# Patient Record
Sex: Female | Born: 1980 | Race: White | Hispanic: No | Marital: Married | Smoking: Never smoker
Health system: Southern US, Community
[De-identification: ages and names within clinical notes are randomized; demographics above are authoritative.]

## PROBLEM LIST (undated history)

## (undated) DIAGNOSIS — E079 Disorder of thyroid, unspecified: Secondary | ICD-10-CM

## (undated) DIAGNOSIS — N200 Calculus of kidney: Secondary | ICD-10-CM

## (undated) HISTORY — DX: Calculus of kidney: N20.0

## (undated) HISTORY — PX: CYST REMOVAL HAND: SHX6279

---

## 2020-04-16 ENCOUNTER — Emergency Department (HOSPITAL_COMMUNITY)
Admission: EM | Admit: 2020-04-16 | Discharge: 2020-04-16 | Disposition: A | Discharge status: Home / Self Care | Payer: 59 | Attending: Emergency Medicine | Admitting: Emergency Medicine

## 2020-04-16 ENCOUNTER — Other Ambulatory Visit: Payer: Self-pay

## 2020-04-16 DIAGNOSIS — T782XXA Anaphylactic shock, unspecified, initial encounter: Secondary | ICD-10-CM | POA: Insufficient documentation

## 2020-04-16 DIAGNOSIS — R062 Wheezing: Secondary | ICD-10-CM | POA: Insufficient documentation

## 2020-04-16 DIAGNOSIS — R202 Paresthesia of skin: Secondary | ICD-10-CM | POA: Diagnosis not present

## 2020-04-16 MED ORDER — EPINEPHRINE 0.3 MG/0.3ML IJ SOAJ: 0.3000 mg | Freq: Once | INTRAMUSCULAR | 1 refills | Status: AC

## 2020-04-16 MED ORDER — EPINEPHRINE 0.3 MG/0.3ML IJ SOAJ
INTRAMUSCULAR | Status: AC
  Filled 2020-04-16: qty 0.3

## 2020-04-16 MED ORDER — PREDNISONE 20 MG PO TABS: 20.0000 mg | ORAL_TABLET | Freq: Two times a day (BID) | ORAL | 0 refills | Status: DC

## 2020-04-16 MED ORDER — FAMOTIDINE IN NACL 20-0.9 MG/50ML-% IV SOLN
20.0000 mg | Freq: Once | INTRAVENOUS | Status: AC
  Administered 2020-04-16: 20 mg via INTRAVENOUS
  Filled 2020-04-16: qty 50

## 2020-04-16 MED ORDER — PREDNISONE 20 MG PO TABS
60.0000 mg | ORAL_TABLET | Freq: Once | ORAL | Status: AC
  Administered 2020-04-16: 60 mg via ORAL
  Filled 2020-04-16: qty 3

## 2020-04-16 NOTE — ED Triage Notes (Signed)
Pt arrives by [facility] w/ complaints of having an allergic rxn after feeding a student food that had tree nuts in it. Pt is allergic to tree nuts. At the school, the pt had swelling of the throat,tongue and itchiness, the staff also heard wheezing. Staff at school administered 2 epi auto injector pens and when EMS arrived on scene they gave 50 mg of Benadryl IV.  Pt ambulatory, AOx4. NAD at this time.    BP-125/60 HR-86 SpO2-100%  Temp-99.2

## 2020-04-16 NOTE — Discharge Instructions (Addendum)
Continue treating the allergic reaction and anaphylaxis with the prednisone prescription as prescribed, beginning this evening.  Also take famotidine 20 mg twice a day for 5 days, and diphenhydramine 25 mg 4 times a day for 5 days.  Get plenty of rest and drink a lot of fluids.  Return here if needed for problems.

## 2020-04-16 NOTE — ED Notes (Signed)
Pt stated mouth numb and throat feels like it is closing up. Informed Dr. Effie Shy.

## 2020-04-16 NOTE — ED Provider Notes (Signed)
[facility] EMERGENCY DEPARTMENT Provider Note   CSN: [id number] Arrival date & time: 04/16/20  1018     History Chief Complaint  Patient presents with  . Allergic Reaction    Diana Pierce is a 39 y.o. female.  HPI She is here for evaluation and treatment of her allergic reaction which began this morning.  She first noticed tingling sensation in her tongue, followed by swelling of her lips and heaviness of her tongue.  She went to a nurse at her employment site, and was treated with epinephrine injection, had increased wheezing, and was treated with a second epinephrine injection.  She had some wheezing before the second injection and felt like her throat was closing up.  EMS arrived, she was transferred here.  During transport she received IV Benadryl, 50 mg.  She had normal blood pressure during transport.  Patient feels like her tongue and throat problem have resolved.  She currently feels tired, "like I just ran a marathon."  She has a history of peanut allergy, 5 years ago and feels like she may have been exposed to peanuts today, when she kissed her child this morning before going to work.  No other recent illnesses.  There are no other known modifying factors.    No past medical history on file.  There are no problems to display for this patient.     OB History   No obstetric history on file.     No family history on file.  Social History   Tobacco Use  . Smoking status: Not on file  Substance Use Topics  . Alcohol use: Not on file  . Drug use: Not on file    Home Medications Prior to Admission medications   Medication Sig Start Date End Date Taking? Authorizing Provider  levothyroxine (SYNTHROID) 50 MCG tablet Take 50 mcg by mouth daily. 04/04/20  Yes [provider]  EPINEPHrine 0.3 mg/0.3 mL IJ SOAJ injection Inject 0.3 mg into the muscle once for 1 dose. For severe allergic reaction 04/16/20 04/16/20  Mancel Bale, MD    predniSONE (DELTASONE) 20 MG tablet Take 1 tablet (20 mg total) by mouth 2 (two) times daily. 04/16/20   Mancel Bale, MD    Allergies    Other  Review of Systems   Review of Systems  All other systems reviewed and are negative.   Physical Exam Updated Vital Signs BP 102/64   Pulse 76   Temp 99 F (37.2 C) (Oral)   Resp 15   Ht 5\' 3"  (1.6 m)   Wt 57.6 kg   SpO2 98%   BMI 22.50 kg/m   Physical Exam Vitals and nursing note reviewed.  Constitutional:      Appearance: She is well-developed.  HENT:     Head: Normocephalic and atraumatic.     Right Ear: External ear normal.     Left Ear: External ear normal.     Nose: Nose normal.     Mouth/Throat:     Mouth: Mucous membranes are moist.     Pharynx: No oropharyngeal exudate or posterior oropharyngeal erythema.     Comments: No swelling of lips, tongue, sublingual space or oropharynx. Eyes:     Conjunctiva/sclera: Conjunctivae normal.     Pupils: Pupils are equal, round, and reactive to light.  Neck:     Trachea: Phonation normal.  Cardiovascular:     Rate and Rhythm: Normal rate and regular rhythm.     Heart sounds: Normal heart  sounds.  Pulmonary:     Effort: Pulmonary effort is normal. No respiratory distress.     Breath sounds: Normal breath sounds. No stridor. No wheezing or rhonchi.  Abdominal:     Palpations: Abdomen is soft.     Tenderness: There is no abdominal tenderness.  Musculoskeletal:        General: Normal range of motion.     Cervical back: Normal range of motion and neck supple.  Skin:    General: Skin is warm and dry.  Neurological:     Mental Status: She is alert and oriented to person, place, and time.     Cranial Nerves: No cranial nerve deficit.     Sensory: No sensory deficit.     Motor: No abnormal muscle tone.     Coordination: Coordination normal.  Psychiatric:        Mood and Affect: Mood normal.        Behavior: Behavior normal.        Thought Content: Thought content normal.         Judgment: Judgment normal.     ED Results / Procedures / Treatments   Labs (all labs ordered are listed, but only abnormal results are displayed) Labs Reviewed - No data to display  EKG None  Radiology No results found.  Procedures Procedures (including critical care time)  Medications Ordered in ED Medications  EPINEPHrine (EPI-PEN) 0.3 mg/0.3 mL injection (has no administration in time range)  predniSONE (DELTASONE) tablet 60 mg (60 mg Oral Given 04/16/20 1046)  famotidine (PEPCID) IVPB 20 mg premix (0 mg Intravenous Stopped 04/16/20 1344)    ED Course  I have reviewed the triage vital signs and the nursing notes.  Pertinent labs & imaging results that were available during my care of the patient were reviewed by me and considered in my medical decision making (see chart for details).  Clinical Course as of Apr 16 1632  Wed Apr 16, 2020  1135 At this time, the patient feels like her throat is closing up.  Vital signs including oxygen saturation is normal.  No evidence of swelling of tongue, or oropharynx.  No stridor.  On auscultation, she has shallow inspiration, but there is no audible wheezes, rales or rhonchi.  There is no stridor or respiratory distress.  She is lying supine.  She states that she is breathing shallowly, because of the discomfort in her throat.  No indication for epinephrine at this time.  Will add Pepcid.   [EW]  1244 Pepcid was started about an hour ago and the patient now states that she is feeling better but is sleepy.  No respiratory distress at this time.  We will continue to observe for 1 more hour.   [EW]    Clinical Course User Index [EW] Mancel Bale, MD   MDM Rules/Calculators/A&P                           Patient Vitals for the past 24 hrs:  BP Temp Temp src Pulse Resp SpO2 Height Weight  04/16/20 1545 102/64 -- -- 76 15 98 % -- --  04/16/20 1540 -- -- -- 72 19 96 % -- --  04/16/20 1505 (!) 106/55 99 F (37.2 C) Oral 71 18 98 %  -- --  04/16/20 1415 103/71 -- -- 76 20 97 % -- --  04/16/20 1330 101/62 -- -- 64 20 98 % -- --  04/16/20 1315 (!) 102/58 -- --  65 20 98 % -- --  04/16/20 1300 (!) 105/56 -- -- 69 20 98 % -- --  04/16/20 1245 (!) 110/59 -- -- 79 16 98 % -- --  04/16/20 1230 (!) 107/54 -- -- 65 19 98 % -- --  04/16/20 1215 105/61 -- -- 66 19 99 % -- --  04/16/20 1200 109/60 -- -- 66 18 99 % -- --  04/16/20 1145 (!) 110/57 -- -- 77 18 98 % -- --  04/16/20 1130 116/64 -- -- 81 19 100 % -- --  04/16/20 1100 (!) 110/58 -- -- 71 (!) 25 99 % -- --  04/16/20 1035 119/76 98.4 F (36.9 C) -- 87 16 99 % -- --  04/16/20 1024 -- -- -- -- -- -- 5\' 3"  (1.6 m) 57.6 kg    At discharge- reevaluation with update and discussion. After initial assessment and treatment, an updated evaluation reveals she is comfortable and has no respiratory distress.  Findings discussed with the patient and all questions were answered.   Medical Decision Making:  This patient is presenting for evaluation of allergic reaction, which does require a range of treatment options, and is a complaint that involves a high risk of morbidity and mortality. The differential diagnoses include anaphylaxis, peanut allergy, nonspecific complaints. I decided to review old records, and in summary patient with history of peanut allergy with signs and symptoms of anaphylaxis, without documented low blood pressure, presenting in improved state after receiving epinephrine injection x2..  I not require additional historical information from anyone.   Critical Interventions-clinical evaluation, medication treatment, observation and reassessment  After These Interventions, the Patient was reevaluated and was found stable for discharge.  Anaphylaxis, without low blood pressure.  Patient improved after treatment both outpatient and here in the ED.  No ongoing allergic reaction requiring hospitalization or further ED intervention  CRITICAL  CARE-yes Performed by: Mancel Bale  Nursing Notes Reviewed/ Care Coordinated Applicable Imaging Reviewed Interpretation of Laboratory Data incorporated into ED treatment  The patient appears reasonably screened and/or stabilized for discharge and I doubt any other medical condition or other [facility] requiring further screening, evaluation, or treatment in the ED at this time prior to discharge.  Plan: Home Medications-continue current medications; Home Treatments-gradual advance diet and activity; return here if the recommended treatment, does not improve the symptoms; Recommended follow up-PCP follow-up 1 week, consider allergy consultation.     Final Clinical Impression(s) / ED Diagnoses Final diagnoses:  Anaphylaxis, initial encounter    Rx / DC Orders ED Discharge Orders         Ordered    EPINEPHrine 0.3 mg/0.3 mL IJ SOAJ injection   Once        04/16/20 1548    predniSONE (DELTASONE) 20 MG tablet  2 times daily        04/16/20 1548           13/03/21, MD 04/16/20 [phone]

## 2020-04-16 NOTE — ED Notes (Signed)
Pt discharged ambulatory. All questions and concerns addressed. No complaints at this time.   

## 2020-08-25 ENCOUNTER — Other Ambulatory Visit (HOSPITAL_COMMUNITY): Payer: Self-pay | Admitting: Internal Medicine

## 2020-08-25 DIAGNOSIS — Z8249 Family history of ischemic heart disease and other diseases of the circulatory system: Secondary | ICD-10-CM

## 2020-09-22 ENCOUNTER — Other Ambulatory Visit: Payer: Self-pay

## 2020-09-22 ENCOUNTER — Other Ambulatory Visit (HOSPITAL_COMMUNITY): Payer: Self-pay | Admitting: Internal Medicine

## 2020-09-22 ENCOUNTER — Ambulatory Visit (HOSPITAL_COMMUNITY): Payer: 59 | Attending: Cardiology

## 2020-09-22 DIAGNOSIS — Z136 Encounter for screening for cardiovascular disorders: Secondary | ICD-10-CM | POA: Diagnosis not present

## 2020-09-22 DIAGNOSIS — Z8249 Family history of ischemic heart disease and other diseases of the circulatory system: Secondary | ICD-10-CM | POA: Insufficient documentation

## 2020-09-22 LAB — ECHOCARDIOGRAM COMPLETE
Area-P 1/2: 2.22 cm2
S' Lateral: 2.8 cm

## 2021-03-10 ENCOUNTER — Encounter (HOSPITAL_BASED_OUTPATIENT_CLINIC_OR_DEPARTMENT_OTHER): Payer: Self-pay | Admitting: *Deleted

## 2021-03-10 ENCOUNTER — Other Ambulatory Visit: Payer: Self-pay

## 2021-03-10 ENCOUNTER — Emergency Department (HOSPITAL_BASED_OUTPATIENT_CLINIC_OR_DEPARTMENT_OTHER): Payer: 59

## 2021-03-10 ENCOUNTER — Emergency Department (HOSPITAL_BASED_OUTPATIENT_CLINIC_OR_DEPARTMENT_OTHER)
Admission: EM | Admit: 2021-03-10 | Discharge: 2021-03-10 | Disposition: A | Discharge status: Home / Self Care | Payer: 59 | Attending: Emergency Medicine | Admitting: Emergency Medicine

## 2021-03-10 DIAGNOSIS — N200 Calculus of kidney: Secondary | ICD-10-CM | POA: Insufficient documentation

## 2021-03-10 DIAGNOSIS — R1031 Right lower quadrant pain: Secondary | ICD-10-CM

## 2021-03-10 HISTORY — DX: Disorder of thyroid, unspecified: E07.9

## 2021-03-10 LAB — COMPREHENSIVE METABOLIC PANEL
ALT: 7 U/L (ref 0–44)
AST: 16 U/L (ref 15–41)
Albumin: 4.5 g/dL (ref 3.5–5.0)
Alkaline Phosphatase: 38 U/L (ref 38–126)
Anion gap: 9 (ref 5–15)
BUN: 12 mg/dL (ref 6–20)
CO2: 22 mmol/L (ref 22–32)
Calcium: 9.6 mg/dL (ref 8.9–10.3)
Chloride: 107 mmol/L (ref 98–111)
Creatinine, Ser: 0.73 mg/dL (ref 0.44–1.00)
GFR, Estimated: >60 mL/min (ref >60)
Glucose, Bld: 99 mg/dL (ref 70–99)
Potassium: 4 mmol/L (ref 3.5–5.1)
Sodium: 138 mmol/L (ref 135–145)
Total Bilirubin: 0.8 mg/dL (ref 0.3–1.2)
Total Protein: 7.4 g/dL (ref 6.5–8.1)

## 2021-03-10 LAB — URINALYSIS, MICROSCOPIC (REFLEX)
Bacteria, UA: NONE SEEN
RBC / HPF: >50 RBC/hpf (ref 0–5)
WBC, UA: NONE SEEN WBC/hpf (ref 0–5)

## 2021-03-10 LAB — URINALYSIS, ROUTINE W REFLEX MICROSCOPIC
Bilirubin Urine: NEGATIVE
Glucose, UA: NEGATIVE mg/dL
Ketones, ur: NEGATIVE mg/dL
Leukocytes,Ua: NEGATIVE
Nitrite: NEGATIVE
Protein, ur: NEGATIVE mg/dL
Specific Gravity, Urine: 1.02 (ref 1.005–1.030)
pH: 8 (ref 5.0–8.0)

## 2021-03-10 LAB — CBC
HCT: 42.5 % (ref 36.0–46.0)
Hemoglobin: 14.5 g/dL (ref 12.0–15.0)
MCH: 30.1 pg (ref 26.0–34.0)
MCHC: 34.1 g/dL (ref 30.0–36.0)
MCV: 88.2 fL (ref 80.0–100.0)
Platelets: 215 10*3/uL (ref 150–400)
RBC: 4.82 MIL/uL (ref 3.87–5.11)
RDW: 12.5 % (ref 11.5–15.5)
WBC: 6.5 10*3/uL (ref 4.0–10.5)
nRBC: 0 % (ref 0.0–0.2)

## 2021-03-10 LAB — PREGNANCY, URINE: Preg Test, Ur: NEGATIVE

## 2021-03-10 MED ORDER — ONDANSETRON HCL 4 MG/2ML IJ SOLN
4.0000 mg | Freq: Once | INTRAMUSCULAR | Status: AC
  Administered 2021-03-10: 4 mg via INTRAVENOUS
  Filled 2021-03-10: qty 2

## 2021-03-10 MED ORDER — HYDROMORPHONE HCL 1 MG/ML IJ SOLN
0.5000 mg | Freq: Once | INTRAMUSCULAR | Status: AC
  Administered 2021-03-10: 0.5 mg via INTRAVENOUS
  Filled 2021-03-10: qty 1

## 2021-03-10 MED ORDER — SODIUM CHLORIDE 0.9 % IV BOLUS
1000.0000 mL | Freq: Once | INTRAVENOUS | Status: AC
  Administered 2021-03-10: 1000 mL via INTRAVENOUS

## 2021-03-10 MED ORDER — HYDROMORPHONE HCL 1 MG/ML IJ SOLN
1.0000 mg | Freq: Once | INTRAMUSCULAR | Status: AC
  Administered 2021-03-10: 1 mg via INTRAVENOUS
  Filled 2021-03-10: qty 1

## 2021-03-10 MED ORDER — KETOROLAC TROMETHAMINE 15 MG/ML IJ SOLN
15.0000 mg | Freq: Once | INTRAMUSCULAR | Status: AC
  Administered 2021-03-10: 15 mg via INTRAVENOUS
  Filled 2021-03-10: qty 1

## 2021-03-10 NOTE — ED Triage Notes (Signed)
Suprapubic pain since Sunday.   No vaginal discharges.

## 2021-03-10 NOTE — ED Provider Notes (Signed)
[facility] EMERGENCY DEPT Provider Note   CSN: [id number] Arrival date & time: 03/10/21  0746     History Chief Complaint  Patient presents with   Abdominal Pain    Diana Pierce is a 40 y.o. female.  Patient c/o right lower abdomen pain onset 2 days ago. Symptoms acute onset, moderate, dull, non radiating, constant. Denies any vaginal discharge or bleeding. No dysuria or hematuria. No back/flank pain. Remote hx ovarian cyst - but felt different. No hx appendicitis. No nausea/vomiting. Having normal bms. No abd distension. No fever or chills. No hx kidney stones.   The history is provided by the patient and medical records.  Abdominal Pain Associated symptoms: no chest pain, no chills, no constipation, no diarrhea, no dysuria, no fever, no hematuria, no shortness of breath, no sore throat, no vaginal bleeding, no vaginal discharge and no vomiting       Past Medical History:  Diagnosis Date   Thyroid disease     There are no problems to display for this patient.   History reviewed. No pertinent surgical history.   OB History     Gravida  2   Para  2   Term      Preterm      AB      Living         SAB      IAB      Ectopic      Multiple      Live Births              No family history on file.  Social History   Tobacco Use   Smoking status: Never   Smokeless tobacco: Never  Vaping Use   Vaping Use: Never used  Substance Use Topics   Alcohol use: Yes    Comment: occasionally   Drug use: Never    Home Medications Prior to Admission medications   Medication Sig Start Date End Date Taking? Authorizing Provider  levothyroxine (SYNTHROID) 50 MCG tablet Take 50 mcg by mouth daily. 04/04/20  Yes [provider]    Allergies    Other  Review of Systems   Review of Systems  Constitutional:  Negative for chills and fever.  HENT:  Negative for sore throat.   Eyes:  Negative for redness.  Respiratory:  Negative  for shortness of breath.   Cardiovascular:  Negative for chest pain.  Gastrointestinal:  Positive for abdominal pain. Negative for constipation, diarrhea and vomiting.  Genitourinary:  Negative for dysuria, flank pain, hematuria, vaginal bleeding and vaginal discharge.  Musculoskeletal:  Negative for back pain.  Skin:  Negative for rash.  Neurological:  Negative for headaches.  Hematological:  Does not bruise/bleed easily.  Psychiatric/Behavioral:  Negative for confusion.    Physical Exam Updated Vital Signs BP 132/68   Pulse 75   Temp 98.6 F (37 C) (Oral)   Resp 18   Ht 1.6 m (5\' 3" )   Wt 54.4 kg   LMP 02/08/2021   SpO2 99%   BMI 21.26 kg/m   Physical Exam Vitals and nursing note reviewed.  Constitutional:      Appearance: Normal appearance. She is well-developed.  HENT:     Head: Atraumatic.     Nose: Nose normal.     Mouth/Throat:     Mouth: Mucous membranes are moist.  Eyes:     General: No scleral icterus.    Conjunctiva/sclera: Conjunctivae normal.  Neck:     Trachea: No tracheal  deviation.  Cardiovascular:     Rate and Rhythm: Normal rate.     Pulses: Normal pulses.  Pulmonary:     Effort: Pulmonary effort is normal. No respiratory distress.  Abdominal:     General: Bowel sounds are normal. There is no distension.     Palpations: Abdomen is soft. There is no mass.     Tenderness: There is abdominal tenderness. There is no guarding or rebound.     Hernia: No hernia is present.     Comments: RLQ/right lower pelvis tenderness.   Genitourinary:    Comments: No cva tenderness. Chaperoned pelvic exam Bosie Clos) - no cervical motion tenderness or purulent discharge. No adnexal mass or tenderness.  Musculoskeletal:        General: No swelling.     Cervical back: Neck supple. No muscular tenderness.  Skin:    General: Skin is warm and dry.     Findings: No rash.  Neurological:     Mental Status: She is alert.     Comments: Alert, speech normal.   Psychiatric:         Mood and Affect: Mood normal.    ED Results / Procedures / Treatments   Labs (all labs ordered are listed, but only abnormal results are displayed) Results for orders placed or performed during the hospital encounter of 03/10/21  Urinalysis, Routine w reflex microscopic Urine, Clean Catch  Result Value Ref Range   Color, Urine YELLOW YELLOW   APPearance CLEAR CLEAR   Specific Gravity, Urine 1.020 1.005 - 1.030   pH 8.0 5.0 - 8.0   Glucose, UA NEGATIVE NEGATIVE mg/dL   Hgb urine dipstick LARGE (A) NEGATIVE   Bilirubin Urine NEGATIVE NEGATIVE   Ketones, ur NEGATIVE NEGATIVE mg/dL   Protein, ur NEGATIVE NEGATIVE mg/dL   Nitrite NEGATIVE NEGATIVE   Leukocytes,Ua NEGATIVE NEGATIVE  Pregnancy, urine  Result Value Ref Range   Preg Test, Ur NEGATIVE NEGATIVE  CBC  Result Value Ref Range   WBC 6.5 4.0 - 10.5 K/uL   RBC 4.82 3.87 - 5.11 MIL/uL   Hemoglobin 14.5 12.0 - 15.0 g/dL   HCT 27.0 62.3 - 76.2 %   MCV 88.2 80.0 - 100.0 fL   MCH 30.1 26.0 - 34.0 pg   MCHC 34.1 30.0 - 36.0 g/dL   RDW 83.1 51.7 - 61.6 %   Platelets 215 150 - 400 K/uL   nRBC 0.0 0.0 - 0.2 %  Comprehensive metabolic panel  Result Value Ref Range   Sodium 138 135 - 145 mmol/L   Potassium 4.0 3.5 - 5.1 mmol/L   Chloride 107 98 - 111 mmol/L   CO2 22 22 - 32 mmol/L   Glucose, Bld 99 70 - 99 mg/dL   BUN 12 6 - 20 mg/dL   Creatinine, Ser 0.73 0.44 - 1.00 mg/dL   Calcium 9.6 8.9 - 71.0 mg/dL   Total Protein 7.4 6.5 - 8.1 g/dL   Albumin 4.5 3.5 - 5.0 g/dL   AST 16 15 - 41 U/L   ALT 7 0 - 44 U/L   Alkaline Phosphatase 38 38 - 126 U/L   Total Bilirubin 0.8 0.3 - 1.2 mg/dL   GFR, Estimated >62 >69 mL/min   Anion gap 9 5 - 15  Urinalysis, Microscopic (reflex)  Result Value Ref Range   RBC / HPF >50 0 - 5 RBC/hpf   WBC, UA NONE SEEN 0 - 5 WBC/hpf   Bacteria, UA NONE SEEN NONE SEEN   Squamous  Epithelial / LPF 0-5 0 - 5     EKG None  Radiology CT ABDOMEN PELVIS WO CONTRAST  Result Date:  03/10/2021 CLINICAL DATA:  Right lower quadrant pain EXAM: CT ABDOMEN AND PELVIS WITHOUT CONTRAST TECHNIQUE: Multidetector CT imaging of the abdomen and pelvis was performed following the standard protocol without IV contrast. COMPARISON:  None. FINDINGS: Lower chest: Lung bases are clear. No effusions. Heart is normal size. Hepatobiliary: No focal hepatic abnormality. Gallbladder unremarkable. Pancreas: No focal abnormality or ductal dilatation. Spleen: No focal abnormality.  Normal size. 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 Punctate nonobstructing right renal stones. No ureteral stones or hydronephrosis. Adrenal glands and urinary bladder unremarkable. Stomach/Bowel: Stomach, large and small bowel grossly unremarkable. Normal appendix. Vascular/Lymphatic: No evidence of aneurysm or adenopathy. Reproductive: Central calcifications in the uterus may be endometrium. No visible uterine or adnexal mass. Other: No free fluid or free air. Musculoskeletal: No acute bony abnormality. IMPRESSION: Right punctate nephrolithiasis. No ureteral stones or hydronephrosis. 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 No acute findings. Electronically Signed   By: Charlett Nose M.D.   On: 03/10/2021 09:57    Procedures Procedures   Medications Ordered in ED Medications  sodium chloride 0.9 % bolus 1,000 mL (1,000 mLs Intravenous New Bag/Given 03/10/21 3846)  HYDROmorphone (DILAUDID) injection 1 mg (1 mg Intravenous Given 03/10/21 0822)  ondansetron (ZOFRAN) injection 4 mg (4 mg Intravenous Given 03/10/21 6599)    ED Course  I have reviewed the triage vital signs and the nursing notes.  Pertinent labs & imaging results that were available during my care of the patient were reviewed by me and considered in my medical decision making (see chart for details).    MDM Rules/Calculators/A&P                            Iv ns. Continuous pulse ox and monitoring. Stat labs.   Reviewed nursing notes and prior charts for additional history.   Pt requests pain meds/has ride.   Dilaudid 1 mg iv. Zofran iv.   Pain improved w meds.   Labs reviewed/interpreted by me - preg neg. Wbc normal.   Pain persists, seems colicky/waxing and waning, pt requests more meds. Toradol iv, dilaudid iv.   Will get ct.   CT reviewed/interpreted by me - small right kidney stone, no obstructing stone or other acute process.   Pain has completely resolved on recheck. Abd soft non tender. Given hematuria, no vaginal bleeding, small stones on CT - ?possible passage of small stone.    As symptoms resolved, appears stable for d/c.   Rec f/u re incidental ct findings.    Final Clinical Impression(s) / ED Diagnoses Final diagnoses:  None    Rx / DC Orders ED Discharge Orders     None        Cathren Laine, MD 03/10/21 1043

## 2021-03-10 NOTE — ED Notes (Signed)
Patient verbalizes understanding of discharge instructions. Opportunity for questioning and answers were provided. Patient discharged from ED.  °

## 2021-03-10 NOTE — Discharge Instructions (Addendum)
It was our pleasure to provide your ER care today - we hope that you feel better.  Your ct scan was read as showing: Central calcifications in the uterus may be endometrium. No visible uterine or adnexal mass. Other: No free fluid or free air.  Right punctate nephrolithiasis. No ureteral stones or hydronephrosis. 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     It appears your pain (now resolved) could have been the result of passage of a small kidney stone. As relates the incidental findings noted (I.e. calcification in uterus and small density left kidney) - discuss with primary care doctor, and discuss possible need for repeat imaging/ultrasound in the next couple months.   Make sure to drink plenty of fluids/stay well hydrated.   Return to ER if worse, new symptoms, fevers, new/severe pain, or other concern.   You were given pain meds in the ER - no driving for the next 6 hours.

## 2021-03-27 ENCOUNTER — Other Ambulatory Visit: Payer: Self-pay

## 2021-03-27 ENCOUNTER — Ambulatory Visit (INDEPENDENT_AMBULATORY_CARE_PROVIDER_SITE_OTHER): Payer: 59 | Admitting: Internal Medicine

## 2021-03-27 ENCOUNTER — Encounter: Payer: Self-pay | Admitting: Internal Medicine

## 2021-03-27 VITALS — BP 106/66 | HR 63 | Temp 98.0°F | Ht 62.0 in | Wt 118.2 lb

## 2021-03-27 DIAGNOSIS — E038 Other specified hypothyroidism: Secondary | ICD-10-CM

## 2021-03-27 DIAGNOSIS — N200 Calculus of kidney: Secondary | ICD-10-CM

## 2021-03-27 DIAGNOSIS — E039 Hypothyroidism, unspecified: Secondary | ICD-10-CM | POA: Insufficient documentation

## 2021-03-27 DIAGNOSIS — Z0000 Encounter for general adult medical examination without abnormal findings: Secondary | ICD-10-CM | POA: Diagnosis not present

## 2021-03-27 DIAGNOSIS — Z1231 Encounter for screening mammogram for malignant neoplasm of breast: Secondary | ICD-10-CM

## 2021-03-27 MED ORDER — KETOROLAC TROMETHAMINE 10 MG PO TABS: 10.0000 mg | ORAL_TABLET | Freq: Four times a day (QID) | ORAL | 0 refills | Status: DC | PRN

## 2021-03-27 NOTE — Patient Instructions (Signed)
Diana Pierce!!!  Thank you for coming to see me.   For your stones, I am going to request records from Dr. Arita Miss.   You are due to start Mammogram's, schedule this when your kidney stones are resolved, no rush at this time.   I will check a TSH today.   You can come back in 1 year, sooner if you need, just call me!  Take Care

## 2021-03-27 NOTE — Progress Notes (Signed)
New Patient Office Visit  Subjective:  Patient ID: Diana Pierce, female    DOB: 1981/01/06  Age: 40 y.o. MRN: [id number]  CC:  Chief Complaint  Patient presents with   Check-up Visit    Physical.    HPI Diana Pierce presents for establishment with new provider and follow up of chronic health issues.  [name] reports that she has been struggling with kidney stones.  She is following with Urology, Dr. Arita Miss.  She is currently on tamsulosin, nitrofurantoin and ketorolac for pain.  She notes that she is not having a strong urinary stream and she has abdominal pain.  No fever.  She just feels uncomfortable.   She further notes a history of thyroid disease.  She was started on thyroid medication due to fatigue, weight gain and she was trying to get pregnant with her second child.  She currently takes levothyroxine 5 times per week.  She has not had her TSH checked in our system.  She has had thyroid ultrasounds and a biopsy once before about 3 years ago.  She has not had an ultrasound since moving to [place].   Past Medical History:  Diagnosis Date   Nephrolithiasis    Thyroid disease     Past Surgical History:  Procedure Laterality Date   CYST REMOVAL HAND Right    wrist    Family History  Problem Relation Age of Onset   Valvular heart disease Mother    Alcoholism Father     Social History   Socioeconomic History   Marital status: Married    Spouse name: Not on file   Number of children: Not on file   Years of education: Not on file   Highest education level: Not on file  Occupational History   Not on file  Tobacco Use   Smoking status: Never   Smokeless tobacco: Never  Vaping Use   Vaping Use: Never used  Substance and Sexual Activity   Alcohol use: Yes    Comment: occasionally   Drug use: Never   Sexual activity: Yes  Other Topics Concern   Not on file  Social History Narrative   Not on file   Social Determinants of Health   Financial Resource  Strain: Not on file  Food Insecurity: Not on file  Transportation Needs: Not on file  Physical Activity: Not on file  Stress: Not on file  Social Connections: Not on file  Intimate Partner Violence: Not on file    ROS Review of Systems  Constitutional:  Positive for fatigue. Negative for activity change, appetite change, chills and fever.  Respiratory:  Negative for cough and shortness of breath.   Cardiovascular:  Negative for chest pain and leg swelling.  Gastrointestinal:  Positive for abdominal distention (urinary). Negative for constipation and diarrhea.  Genitourinary:  Positive for decreased urine volume, difficulty urinating, flank pain and hematuria. Negative for dysuria.  Musculoskeletal:  Positive for back pain.   Objective:   Today's Vitals: BP 106/66 (BP Location: Left Arm, Patient Position: Sitting, Cuff Size: Normal)   Pulse 63   Temp 98 F (36.7 C) (Oral)   Ht 5\' 2"  (1.575 m)   Wt 118 lb 3.2 oz (53.6 kg)   SpO2 99% Comment: RA  BMI 21.62 kg/m   Physical Exam Vitals and nursing note reviewed.  Constitutional:      General: She is not in acute distress.    Appearance: Normal appearance. She is normal weight. She is not toxic-appearing.  HENT:     Head: Normocephalic and atraumatic.  Neck:     Comments: She has a normal sized thyroid, no apparent nodules Cardiovascular:     Rate and Rhythm: Normal rate and regular rhythm.     Heart sounds: No murmur heard.   No gallop.  Pulmonary:     Effort: Pulmonary effort is normal. No respiratory distress.     Breath sounds: Normal breath sounds. No wheezing.  Abdominal:     General: Abdomen is flat. Bowel sounds are normal. There is no distension.     Palpations: Abdomen is soft.     Tenderness: There is abdominal tenderness (suprapubic).  Musculoskeletal:        General: No swelling or tenderness.     Cervical back: Neck supple. No rigidity or tenderness.  Lymphadenopathy:     Cervical: No cervical adenopathy.   Skin:    General: Skin is warm and dry.     Coloration: Skin is not jaundiced.  Neurological:     Mental Status: She is alert and oriented to person, place, and time. Mental status is at baseline.  Psychiatric:        Mood and Affect: Mood normal.        Behavior: Behavior normal.    Assessment & Plan:   Problem List Items Addressed This Visit       Unprioritized   Hypothyroidism   Relevant Orders   TSH   Nephrolithiasis   Relevant Medications   ketorolac (TORADOL) 10 MG tablet   Other Visit Diagnoses     Visit for screening mammogram    -  Primary   Relevant Orders   MM 3D SCREEN BREAST BILATERAL       Outpatient Encounter Medications as of 03/27/2021  Medication Sig   ketorolac (TORADOL) 10 MG tablet Take 1 tablet (10 mg total) by mouth every 6 (six) hours as needed.   levothyroxine (SYNTHROID) 50 MCG tablet Take 50 mcg by mouth daily.   tamsulosin (FLOMAX) 0.4 MG CAPS capsule Take 0.4 mg by mouth daily.   No facility-administered encounter medications on file as of 03/27/2021.    Follow-up: Return in about 1 year (around 03/27/2022).   Debe Coder, MD

## 2021-03-27 NOTE — Assessment & Plan Note (Signed)
We discussed started breast cancer screening today.  Pros/Cons and she has opted to start.  I told her that it was not something to schedule ASAP, but she can get her nephrolithiasis resolved and then schedule.    We will discuss pap smear and immunizations at next visit.

## 2021-03-27 NOTE — Assessment & Plan Note (Signed)
She is following with Urology.  Planning to take medications to allow for the stones to move and she is able to pass them with intervention.  Reports that plan is to do a 24 hour urine collection when she has passed stones.  She will continue on tamsulosin, ketorolac, zofran.  She has 3 more tablets of nitrofurantoin.   Plan Will request records from [facility] Continue treatment course

## 2021-03-27 NOTE — Assessment & Plan Note (Signed)
We will check TSH today.  She is feeling poorly due to the kidney stones, so unclear if she is having worsening symptoms due to thyroid disease today.  She is taking her synthroid 5 days per week b/c her schedule does not allow for taking it on the weekends.    Plan TSH today - assess if she is able to cut back to 3X per week, pregnancy not desired at this time Will do a follow up ultrasound in the future.

## 2021-03-28 LAB — TSH: TSH: 2.49 u[IU]/mL (ref 0.450–4.500)

## 2021-03-30 ENCOUNTER — Ambulatory Visit
Admission: RE | Admit: 2021-03-30 | Discharge: 2021-03-30 | Disposition: A | Discharge status: Home / Self Care | Payer: 59 | Source: Ambulatory Visit | Attending: Internal Medicine | Admitting: Internal Medicine

## 2021-03-30 ENCOUNTER — Ambulatory Visit: Payer: 59

## 2021-03-30 DIAGNOSIS — Z1231 Encounter for screening mammogram for malignant neoplasm of breast: Secondary | ICD-10-CM

## 2021-03-30 NOTE — Progress Notes (Signed)
ROI form faxed to Dr Thomos Lemons office at [facility] for medical records.

## 2021-04-06 ENCOUNTER — Other Ambulatory Visit: Payer: Self-pay | Admitting: Internal Medicine

## 2021-04-06 DIAGNOSIS — R928 Other abnormal and inconclusive findings on diagnostic imaging of breast: Secondary | ICD-10-CM

## 2021-04-08 ENCOUNTER — Ambulatory Visit
Admission: RE | Admit: 2021-04-08 | Discharge: 2021-04-08 | Disposition: A | Discharge status: Home / Self Care | Payer: 59 | Source: Ambulatory Visit | Attending: Internal Medicine | Admitting: Internal Medicine

## 2021-04-08 ENCOUNTER — Other Ambulatory Visit: Payer: Self-pay | Admitting: Internal Medicine

## 2021-04-08 ENCOUNTER — Other Ambulatory Visit: Payer: Self-pay

## 2021-04-08 DIAGNOSIS — N6002 Solitary cyst of left breast: Secondary | ICD-10-CM

## 2021-04-08 DIAGNOSIS — R928 Other abnormal and inconclusive findings on diagnostic imaging of breast: Secondary | ICD-10-CM

## 2021-04-09 ENCOUNTER — Other Ambulatory Visit: Payer: 59

## 2021-04-15 ENCOUNTER — Other Ambulatory Visit: Payer: Self-pay | Admitting: Internal Medicine

## 2021-04-15 DIAGNOSIS — N6002 Solitary cyst of left breast: Secondary | ICD-10-CM

## 2021-04-15 NOTE — Progress Notes (Signed)
Ordered biopsy

## 2021-05-22 ENCOUNTER — Encounter: Payer: 59 | Admitting: Internal Medicine

## 2021-09-22 ENCOUNTER — Other Ambulatory Visit: Payer: Self-pay | Admitting: Internal Medicine

## 2021-09-22 ENCOUNTER — Ambulatory Visit (INDEPENDENT_AMBULATORY_CARE_PROVIDER_SITE_OTHER): Payer: 59 | Admitting: Internal Medicine

## 2021-09-22 ENCOUNTER — Encounter: Payer: Self-pay | Admitting: Internal Medicine

## 2021-09-22 ENCOUNTER — Other Ambulatory Visit: Payer: Self-pay

## 2021-09-22 DIAGNOSIS — H9202 Otalgia, left ear: Secondary | ICD-10-CM | POA: Insufficient documentation

## 2021-09-22 MED ORDER — CIPROFLOXACIN-DEXAMETHASONE 0.3-0.1 % OT SUSP: 4.0000 [drp] | Freq: Two times a day (BID) | OTIC | 0 refills | Status: DC

## 2021-09-22 MED ORDER — CIPRO HC 0.2-1 % OT SUSP: 4.0000 [drp] | Freq: Two times a day (BID) | OTIC | 0 refills | Status: DC

## 2021-09-22 NOTE — Telephone Encounter (Signed)
Patient was seen and [facility] today for left otitis externa.  She was ordered antibacterial steroid eardrops which [facility] did not carry.  I talked with pharmacist and Ciprodex and Cortisporin are both available.  Ordered [facility] as [facility].  I discussed with pharmacist and patient will pick up Ciprodex if affordable otherwise will fill the Cortisporin if the Ciprodex is not covered or too expensive.  Informed patient of this and she will pick up her prescription later this afternoon. ?

## 2021-09-22 NOTE — Patient Instructions (Addendum)
Ms Diana Pierce, ? ?It was a pleasure seeing you in clinic. Today we discussed:  ? ?Ear pain: ?At this time, please use ciprofloxacin-hydrocortisone 4 drops into the left ear twice daily for 7 days. Please contact us if no improvement or worsening of pain.  ? ?If you have any questions or concerns, please call our clinic at [phone] between 9am-5pm and after hours call [phone] and ask for the internal medicine resident on call. If you feel you are having a medical emergency please call 911.  ? ?Thank you, we look forward to helping you remain healthy! ? ?

## 2021-09-22 NOTE — Progress Notes (Signed)
? ?This is a Psychologist, occupational Note.  The care of the patient was discussed with Dr. Eliezer Bottom, MD and the assessment and plan was formulated with their assistance.  Please see their note for official documentation of the patient encounter.  ? ?Subjective:  ? ?Patient ID: Diana Pierce female   DOB: 1981/05/10 42 y.o.   MRN: [id number] ? ?HPI: ?Diana Pierce is a 41 y.o. with PMHx of hypothyroidism, nephrolithiasis who presents with L ear pain. Please see problem-based charting for complete assessment and plan.  ? ?Past Medical History:  ?Diagnosis Date  ? Nephrolithiasis   ? Thyroid disease   ? ?Current Outpatient Medications  ?Medication Sig Dispense Refill  ? ketorolac (TORADOL) 10 MG tablet Take 1 tablet (10 mg total) by mouth every 6 (six) hours as needed. 20 tablet 0  ? levothyroxine (SYNTHROID) 50 MCG tablet Take 50 mcg by mouth daily.    ? tamsulosin (FLOMAX) 0.4 MG CAPS capsule Take 0.4 mg by mouth daily.    ? ?No current facility-administered medications for this visit.  ? ?Family History  ?Problem Relation Age of Onset  ? Valvular heart disease Mother   ? Alcoholism Father   ? Breast cancer Neg Hx   ? ?Social History  ? ?Socioeconomic History  ? Marital status: Married  ?  Spouse name: Not on file  ? Number of children: Not on file  ? Years of education: Not on file  ? Highest education level: Not on file  ?Occupational History  ? Not on file  ?Tobacco Use  ? Smoking status: Never  ? Smokeless tobacco: Never  ?Vaping Use  ? Vaping Use: Never used  ?Substance and Sexual Activity  ? Alcohol use: Yes  ?  Comment: occasionally  ? Drug use: Never  ? Sexual activity: Yes  ?Other Topics Concern  ? Not on file  ?Social History Narrative  ? Not on file  ? ?Social Determinants of Health  ? ?Financial Resource Strain: Not on file  ?Food Insecurity: Not on file  ?Transportation Needs: Not on file  ?Physical Activity: Not on file  ?Stress: Not on file  ?Social Connections: Not on file  ? ?Review of  Systems: ?Pertinent items are noted in HPI. ?Objective:  ?Physical Exam: ?Vitals:  ? 09/22/21 1503  ?BP: (!) 99/55  ?Pulse: 72  ?Temp: 97.7 ?F (36.5 ?C)  ?TempSrc: Oral  ?SpO2: 96%  ?Weight: 121 lb 9.6 oz (55.2 kg)  ?Height: 5\' 2"  (1.575 m)  ? ?BP (!) 99/55 (BP Location: Right Arm, Patient Position: Sitting, Cuff Size: Normal)   Pulse 72   Temp 97.7 ?F (36.5 ?C) (Oral)   Ht 5\' 2"  (1.575 m)   Wt 121 lb 9.6 oz (55.2 kg)   SpO2 96%   BMI 22.24 kg/m?  ? ?General Appearance:    Alert, cooperative, no distress  ?Head:    Normocephalic, atraumatic, L-sided jaw tenderness to palpation below ear, tender preauricular lymph noes.  ?Eyes:    PERRL, conjunctiva/corneas clear, EOM's intact  ?Ears:    R ear normal. L external canal erythematous, tragus tender to light touch, tenderness with ear pulling in any direction. L tympanic membrane intact with no evidence of perforation, no bulging appreciated.   ?Nose:   Nares normal, no drainage or sinus tenderness  ?Throat:   Lips, mucosa, and tongue normal; no evidence of postnasal drip or cobblestoning  ?Neck:   Supple, tender L cervical lymphadenopathy;  ?  thyroid:  no enlargement/tenderness/nodules  ?Lungs:  Clear to auscultation bilaterally, respirations unlabored  ? Heart:    Regular rate and rhythm, S1 and S2 normal, no murmur, rub   or gallop  ? ?Assessment & Plan:  ? ?Please see encounters tab for problem-based charting. ? ?Left ear pain ?Patient has L ear pain that began while she was lying down in bed two days ago. She heard a low pitch and felt tenderness, so she turned to sleep on the opposite side. The pain has increased in severity and today is sensitive to touch. She says she felt constant 4/10 pain but now feels 6/10, sharp pain whenever she touches the ear. She has no ear drainage or difficulty hearing. She has associated L sided facial pressure, L jaw pain, and L eye pressure and itching. On exam, L external auditory canal was erythematous, and tragus was  tender to light touch. Tenderness with any pulling. L-sided jaw tenderness to palpation just below the ear but vesicles or lesions. EOM's intact, nares normal, oral mucosa normal. ? ?She has felt similar symptoms with a sinus infection she had 20 years ago, but the symptoms were bilateral. She denies fevers, nasal drainage, eye discharge, headache, dizziness, appetite changes. She has not taken any medications but tried a nose wash that provided no relief. 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 She has had no recent illnesses, has a remote history of chicken pox and received the vaccine after recovering, no history of nerve problems. Most likely a sinus infection that progressed to ear infection. Less likely trigeminal neuralgia because patient denies shooting pain and no pain elicited by touching side of patient's face. Less likely Ramsay Hunt with no facial paralysis or vesicles near the auditory canal. Prescribing antibiotic + steroid to reduce pain, and patient given strict return precautions.  ? ?Plan ?- Begin Ciprofloxacin-hydrocortisone 4 drops 2x daily in Left Ear for 7 days ? ? ?

## 2021-09-22 NOTE — Progress Notes (Signed)
Attestation for Student Documentation: ? ?I personally was present and performed or re-performed the history, physical exam and medical decision-making activities of this service and have verified that the service and findings are accurately documented in the student?s note. ? ?Patient is presenting with two days of progressively worsening left ear pain without drainage, fevers, or hearing changes. On examination, she has tenderness to palpation of tragus and manipulation of auricle. Significantly erythematous external auditory canal; tympanic membranes appears nl, nonbulging or effusions. She does have preauricular tenderness on exam. Exam is consistent with acute otitis externa. Will treat with 7 day course of ciprofloxacin-hydrocortisone ear drops. If no improvement in symptoms, or worsening, will need to re-evaluate for possible sinusitis vs otitis media.  ? Eliezer Bottom, MD ?[facility] PGY-3 ?09/22/2021, 4:47 PM ? ?

## 2021-09-22 NOTE — Assessment & Plan Note (Addendum)
Patient has L ear pain that began while she was lying down in bed two days ago. She heard a low pitch and felt tenderness, so she turned to sleep on the opposite side. The pain has increased in severity and today is sensitive to touch. She says she felt constant 4/10 pain but now feels 6/10, sharp pain whenever she touches the ear. She has no ear drainage or difficulty hearing. She has associated L sided facial pressure, L jaw pain, and L eye pressure and itching. On exam, L external auditory canal was erythematous, and tragus was tender to light touch. Tenderness with any pulling. L-sided jaw tenderness to palpation just below the ear but no vesicles or lesions. Some tender L cervical lymphadenopathy. EOM's intact, nares normal, oral mucosa normal. ? ?She has felt similar symptoms with a sinus infection she had 20 years ago, but the symptoms were bilateral. She denies fevers, nasal drainage, eye discharge, headache, dizziness, appetite changes. She has not taken any medications but tried a nose wash that provided no relief. 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 She has had no recent illnesses, has a remote history of chicken pox and received the vaccine after recovering, no history of nerve problems. Most likely a viral URI that has progressed to otitis externa. Less likely acute otitis media because even though tender lymph nodes raised suspicion, there was no evidence of bulging tympanic membrane on exam. Less likely trigeminal neuralgia because patient denies shooting pain elicited by touching side of face. Less likely Ramsay Hunt with no facial paralysis or vesicles near the auditory canal. Prescribing antibiotic with steroid to reduce pain and inflammation, and patient given strict return precautions.  ? ?Plan ?- Begin Ciprofloxacin-hydrocortisone 4 drops 2x daily in Left Ear for 7 days ?- Recommended Flonase nasal spray to reduce nasal congestion ?

## 2021-10-06 NOTE — Progress Notes (Signed)
Internal Medicine Clinic Attending ° °Case discussed with Dr. Aslam  At the time of the visit.  We reviewed the resident’s history and exam and pertinent patient test results.  I agree with the assessment, diagnosis, and plan of care documented in the resident’s note.  °

## 2021-10-08 ENCOUNTER — Ambulatory Visit
Admission: RE | Admit: 2021-10-08 | Discharge: 2021-10-08 | Disposition: A | Discharge status: Home / Self Care | Payer: 59 | Source: Ambulatory Visit | Attending: Internal Medicine | Admitting: Internal Medicine

## 2021-10-08 ENCOUNTER — Other Ambulatory Visit: Payer: Self-pay | Admitting: Internal Medicine

## 2021-10-08 DIAGNOSIS — N6002 Solitary cyst of left breast: Secondary | ICD-10-CM

## 2022-02-02 ENCOUNTER — Telehealth: Payer: Self-pay | Admitting: Internal Medicine

## 2022-02-02 MED ORDER — AMOXICILLIN-POT CLAVULANATE 875-125 MG PO TABS: 1.0000 | ORAL_TABLET | Freq: Two times a day (BID) | ORAL | 0 refills | Status: DC

## 2022-02-02 NOTE — Telephone Encounter (Signed)
Discussed the following with patient.    New ear pain with radiation to the mouth, sharp, sensitive to the touch and + fever.  She has had ear infection before, treated with ciprodex in April of this year.  This is now her 2nd recurrence and she has self referred to an ENT group.   As I cannot perform a physical exam, I will plan to treat with oral antibiotics.  She has no allergies.    Treat with Amox-Clav 875-125 BID for 7 days.  Advise follow up with ENT.   Debe Coder, MD

## 2022-02-22 DIAGNOSIS — M26629 Arthralgia of temporomandibular joint, unspecified side: Secondary | ICD-10-CM | POA: Insufficient documentation

## 2022-04-12 ENCOUNTER — Other Ambulatory Visit: Payer: 59

## 2022-04-26 ENCOUNTER — Other Ambulatory Visit: Payer: 59

## 2022-05-13 ENCOUNTER — Other Ambulatory Visit: Payer: 59

## 2022-06-01 ENCOUNTER — Ambulatory Visit
Admission: RE | Admit: 2022-06-01 | Discharge: 2022-06-01 | Disposition: A | Discharge status: Home / Self Care | Payer: 59 | Source: Ambulatory Visit | Attending: Internal Medicine | Admitting: Internal Medicine

## 2022-06-01 DIAGNOSIS — N6002 Solitary cyst of left breast: Secondary | ICD-10-CM

## 2022-07-09 ENCOUNTER — Encounter: Payer: 59 | Admitting: Internal Medicine

## 2022-07-22 ENCOUNTER — Encounter: Payer: Self-pay | Admitting: Internal Medicine

## 2022-07-23 ENCOUNTER — Telehealth (INDEPENDENT_AMBULATORY_CARE_PROVIDER_SITE_OTHER): Payer: 59 | Admitting: Internal Medicine

## 2022-07-23 ENCOUNTER — Encounter: Payer: 59 | Admitting: Internal Medicine

## 2022-07-23 DIAGNOSIS — E038 Other specified hypothyroidism: Secondary | ICD-10-CM

## 2022-07-23 MED ORDER — LEVOTHYROXINE SODIUM 50 MCG PO TABS: 50.0000 ug | ORAL_TABLET | Freq: Every day | ORAL | 3 refills | Status: DC

## 2022-07-23 NOTE — Telephone Encounter (Signed)
Discussed care with Ms. Diana Pierce via phone.  She unfortunately was not able to get off work to come in today.   I will plan to order TFTs for future labs and refill her synthroid.    Further plan can be completed at next available appointment for her.   Gilles Chiquito, MD

## 2023-03-25 ENCOUNTER — Ambulatory Visit: Payer: 59 | Admitting: Internal Medicine

## 2023-03-25 VITALS — BP 113/69 | HR 59 | Temp 98.2°F | Ht 62.0 in | Wt 120.4 lb

## 2023-03-25 DIAGNOSIS — Z0000 Encounter for general adult medical examination without abnormal findings: Secondary | ICD-10-CM

## 2023-03-25 DIAGNOSIS — E039 Hypothyroidism, unspecified: Secondary | ICD-10-CM | POA: Diagnosis not present

## 2023-03-25 DIAGNOSIS — M26629 Arthralgia of temporomandibular joint, unspecified side: Secondary | ICD-10-CM | POA: Diagnosis not present

## 2023-03-25 DIAGNOSIS — Z124 Encounter for screening for malignant neoplasm of cervix: Secondary | ICD-10-CM

## 2023-03-25 DIAGNOSIS — N200 Calculus of kidney: Secondary | ICD-10-CM | POA: Diagnosis not present

## 2023-03-25 DIAGNOSIS — E038 Other specified hypothyroidism: Secondary | ICD-10-CM

## 2023-03-25 NOTE — Patient Instructions (Signed)
Diana Pierce!!  I will call you with any changes.   I have placed a referral for gynecology for you today to get updated on your PAP smear.   Please come back to see me in about 1 year!  Take Care

## 2023-03-25 NOTE — Assessment & Plan Note (Signed)
No further symptoms.  Taking citrus juice daily.  Check BMP and CBC today for stability.

## 2023-03-25 NOTE — Assessment & Plan Note (Signed)
She has a history of abnormal pap.  Normal since 2012 per review of care everywhere.  She is due for repeat.  Will place referral to gynecology here in [place].  MMG is up to date from December.  Repeat due in December.  Discuss lipids and colon cancer screening at 44 years.

## 2023-03-25 NOTE — Progress Notes (Signed)
Established Patient Office Visit  Subjective   Patient ID: Diana Pierce, female    DOB: December 16, 1980  Age: 42 y.o. MRN: [id number]  Chief Complaint  Patient presents with   routine follow up    Diana Pierce is a 42 year old woman with PMH of Hypothyroidism, TMJ and nephrolithiasis who presents for follow up.   She notes that while she was travelling in her home country of [place] over the summer, she became very fatigued and had her TSH checked which was high.  She was advised at that time to take her synthroid 7 days a week instead of 5 days.  She has been feeling better since that time.  She is due for repeat TSH.   She is due for PAP smear in the next year.  She has maintained her gynecologist in [place], however, she would like to change to a physician here in [place].  We will place a referral today.    She has no other complaints.  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  HENT:  Negative for sore throat.   Cardiovascular:  Negative for chest pain and claudication.  Gastrointestinal:  Negative for heartburn.  Genitourinary:  Negative for flank pain and hematuria.  Neurological:  Negative for weakness.      Objective:     BP 113/69 (BP Location: Left Arm, Patient Position: Sitting, Cuff Size: Normal)   Pulse (!) 59   Temp 98.2 F (36.8 C) (Oral)   Ht 5\' 2"  (1.575 m)   Wt 120 lb 6.4 oz (54.6 kg)   SpO2 99%   BMI 22.02 kg/m  BP Readings from Last 3 Encounters:  03/25/23 113/69  09/22/21 (!) 99/55  03/27/21 106/66      Physical Exam Vitals and nursing note reviewed.  Constitutional:      General: She is not in acute distress.    Appearance: Normal  appearance. She is normal weight. She is not toxic-appearing.  HENT:     Head: Normocephalic and atraumatic.  Neck:     Comments: Thyroid is not enlarged or tender, no nodules palpated today.   Cardiovascular:     Rate and Rhythm: Normal rate and regular rhythm.     Heart sounds: No murmur heard.    No gallop.  Pulmonary:     Effort: Pulmonary effort is normal. No respiratory distress.  Musculoskeletal:        General: No swelling or tenderness.     Cervical back: Neck supple. No tenderness.     Right lower leg: No edema.     Left lower leg: No edema.  Skin:    General: Skin is warm and dry.     Findings: No erythema.  Neurological:     General: No focal deficit present.     Mental Status: She is alert and oriented to person, place, and time.  Psychiatric:        Mood and Affect: Mood normal.        Behavior: Behavior normal.         Assessment & Plan:   Problem List Items Addressed This Visit       Unprioritized  Hypothyroidism    She has recently (over the summer in August) increased her synthroid to daily instead of just during the weekday.  We will check a TSH today and give her some recommendations about her medications once this has resulted.        Relevant Orders   TSH   CBC no Diff   Nephrolithiasis    No further symptoms.  Taking citrus juice daily.  Check BMP and CBC today for stability.       Relevant Orders   BMP8+Anion Gap   CBC no Diff   Healthcare maintenance - Primary    She has a history of abnormal pap.  Normal since 2012 per review of care everywhere.  She is due for repeat.  Will place referral to gynecology here in [place].  MMG is up to date from December.  Repeat due in December.  Discuss lipids and colon cancer screening at 42 years.       Relevant Orders   HIV antibody (with reflex)   Hepatitis C Ab reflex to Quant PCR   Ambulatory referral to Gynecology   TMJ pain dysfunction syndrome    No current issues.  Wearing a brace,  avoiding  chewing gum.  Continue to monitor.       Other Visit Diagnoses     Cervical cancer screening       Relevant Orders   Ambulatory referral to Gynecology       Return in about 1 year (around 03/24/2024).    Debe Coder, MD

## 2023-03-25 NOTE — Assessment & Plan Note (Signed)
She has recently (over the summer in August) increased her synthroid to daily instead of just during the weekday.  We will check a TSH today and give her some recommendations about her medications once this has resulted.

## 2023-03-25 NOTE — Assessment & Plan Note (Signed)
No current issues.  Wearing a brace,  avoiding chewing gum.  Continue to monitor.

## 2023-03-27 LAB — CBC
Hematocrit: 43 % (ref 34.0–46.6)
Hemoglobin: 13.8 g/dL (ref 11.1–15.9)
MCH: 29.8 pg (ref 26.6–33.0)
MCHC: 32.1 g/dL (ref 31.5–35.7)
MCV: 93 fL (ref 79–97)
Platelets: 217 10*3/uL (ref 150–450)
RBC: 4.63 x10E6/uL (ref 3.77–5.28)
RDW: 12.2 % (ref 11.7–15.4)
WBC: 5.1 10*3/uL (ref 3.4–10.8)

## 2023-03-27 LAB — BMP8+ANION GAP
Anion Gap: 15 mmol/L (ref 10.0–18.0)
BUN/Creatinine Ratio: 15 (ref 9–23)
BUN: 11 mg/dL (ref 6–24)
CO2: 20 mmol/L (ref 20–29)
Calcium: 9.5 mg/dL (ref 8.7–10.2)
Chloride: 105 mmol/L (ref 96–106)
Creatinine, Ser: 0.75 mg/dL (ref 0.57–1.00)
Glucose: 92 mg/dL (ref 70–99)
Potassium: 4.6 mmol/L (ref 3.5–5.2)
Sodium: 140 mmol/L (ref 134–144)
eGFR: 101 mL/min/{1.73_m2} (ref >59)

## 2023-03-27 LAB — HIV ANTIBODY (ROUTINE TESTING W REFLEX): HIV Screen 4th Generation wRfx: NONREACTIVE

## 2023-03-27 LAB — HCV INTERPRETATION

## 2023-03-27 LAB — TSH: TSH: 2.15 u[IU]/mL (ref 0.450–4.500)

## 2023-03-27 LAB — HCV AB W REFLEX TO QUANT PCR: HCV Ab: NONREACTIVE

## 2023-05-11 LAB — HM PAP SMEAR

## 2023-05-17 ENCOUNTER — Encounter: Payer: 59 | Admitting: Internal Medicine

## 2023-06-10 ENCOUNTER — Other Ambulatory Visit: Payer: Self-pay | Admitting: Internal Medicine

## 2023-06-10 MED ORDER — LEVOTHYROXINE SODIUM 50 MCG PO TABS: 50.0000 ug | ORAL_TABLET | Freq: Every day | ORAL | 3 refills | Status: DC

## 2023-06-14 ENCOUNTER — Other Ambulatory Visit: Payer: Self-pay | Admitting: Internal Medicine

## 2023-06-14 MED ORDER — LEVOTHYROXINE SODIUM 50 MCG PO TABS: 50.0000 ug | ORAL_TABLET | Freq: Every day | ORAL | 1 refills | Status: DC

## 2023-06-14 NOTE — Progress Notes (Signed)
Spoke with prior PCP (Dr. Criselda Peaches). Patient has reached out regarding her synthroid refill. For some reason insurance was not authorizing her fill. I have sent another prescription and will follow up to ensure she is able to pick it up.

## 2023-06-17 ENCOUNTER — Telehealth: Payer: Self-pay | Admitting: Internal Medicine

## 2023-06-17 NOTE — Telephone Encounter (Signed)
 Patient still having difficulty picking up her synthroid prescription. Called [facility], they have a new manufacturer for synthroid and needed a verbal authorization to swtich. Gave verbal order ok to use alternative manufacturer

## 2023-08-21 IMAGING — MG MM DIGITAL SCREENING BILAT W/ TOMO AND CAD
8 series · 9 of 24 positions shown · non-contrast
Comparison: None.

CLINICAL DATA: Screening.

EXAM:
DIGITAL SCREENING BILATERAL MAMMOGRAM WITH TOMOSYNTHESIS AND CAD
TECHNIQUE: Bilateral screening digital craniocaudal and mediolateral oblique
mammograms were obtained. Bilateral screening digital breast
tomosynthesis was performed. The images were evaluated with
computer-aided detection.

[L MLO synth-2D]
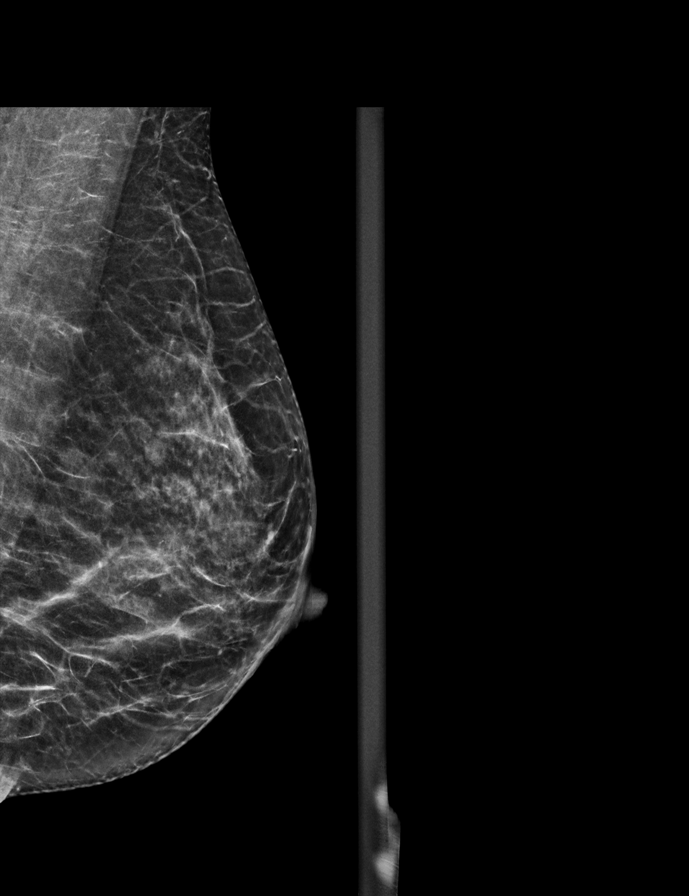

[R MLO synth-2D]
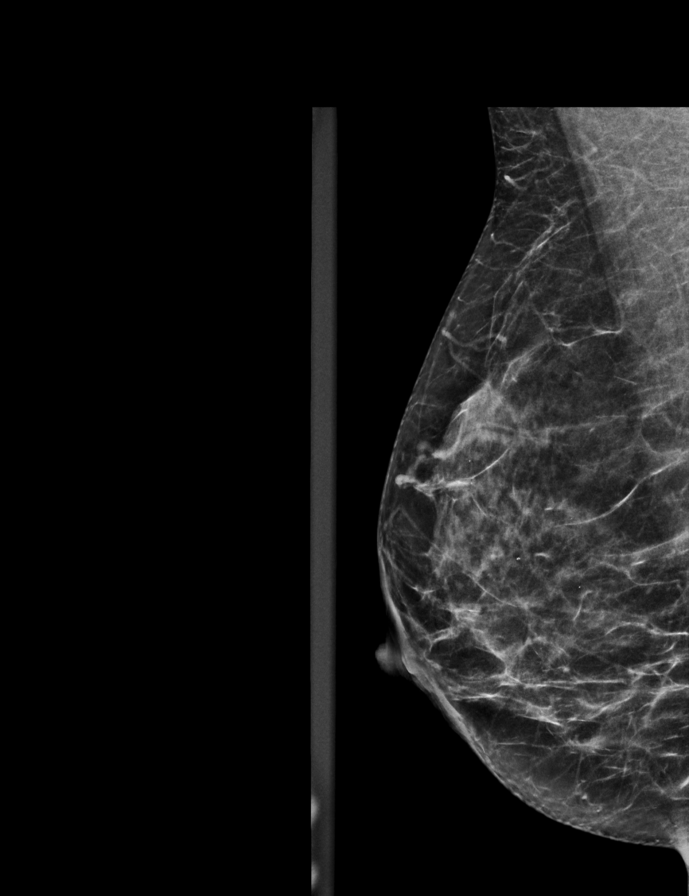

[R CC synth-2D]
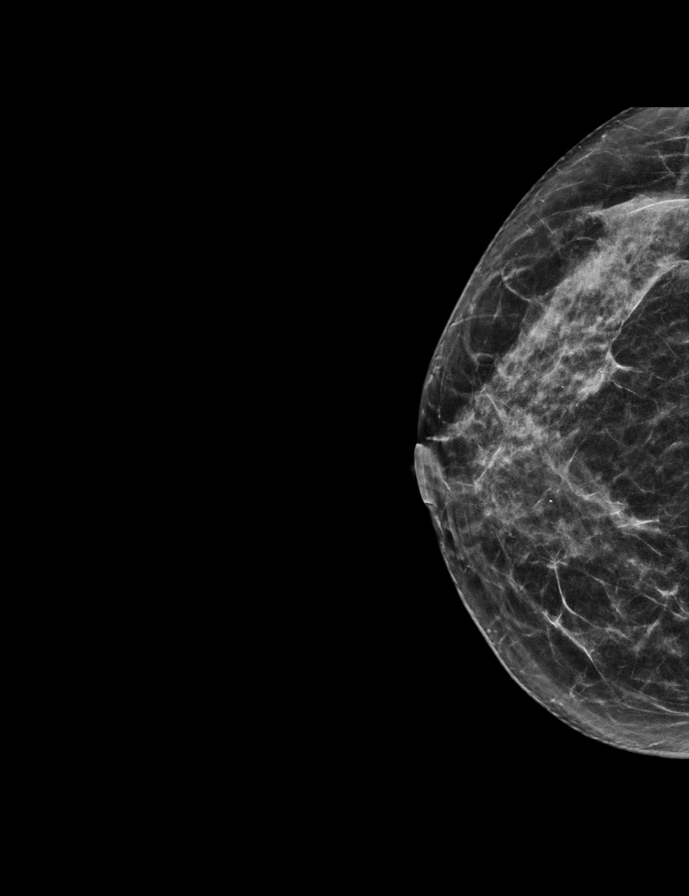

[L CC synth-2D]
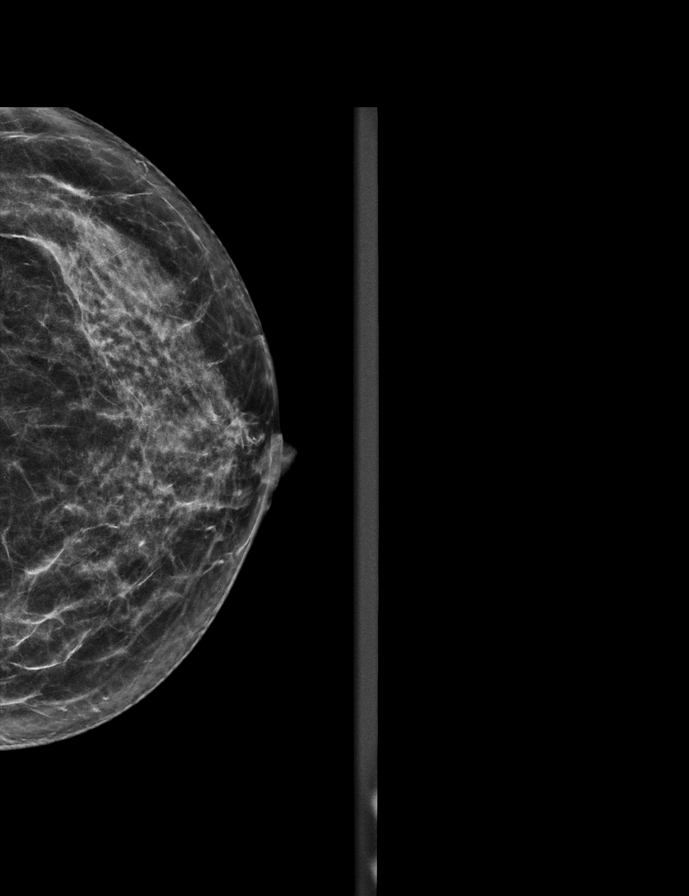

[L CC tomo · 2 of 52 frames shown]
[frame 17/52]
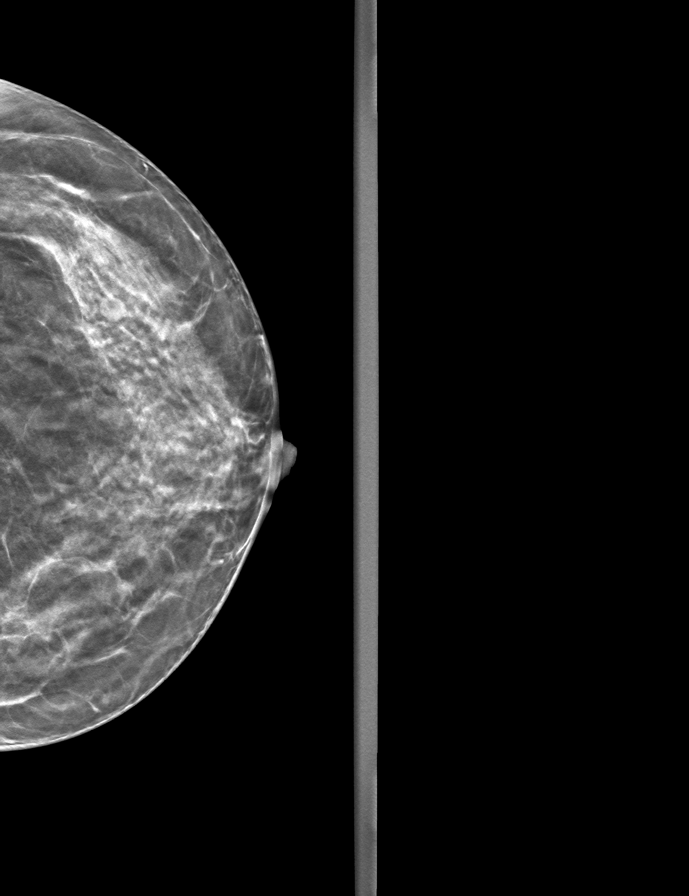
[frame 27/52]
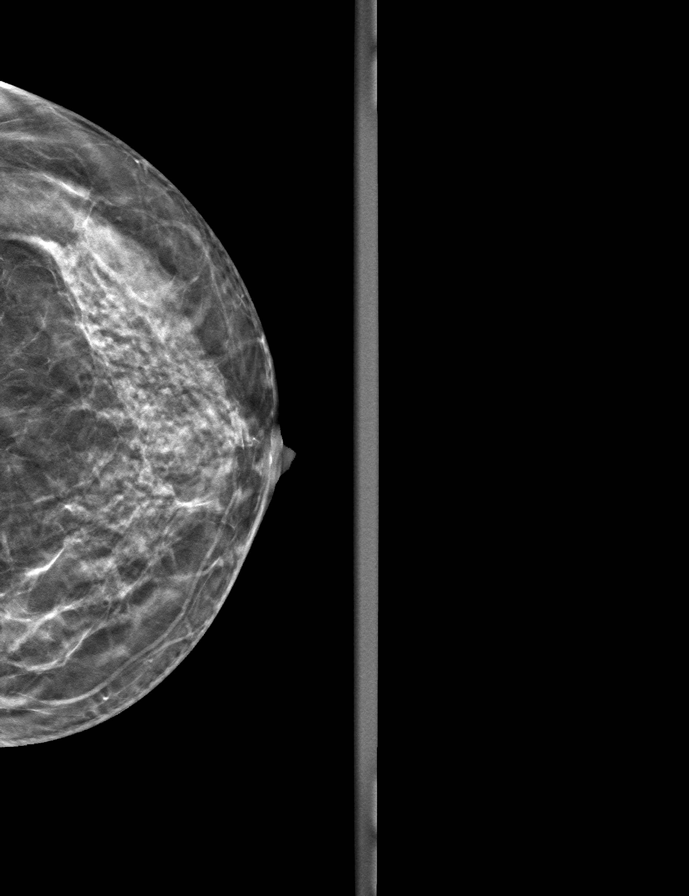

[R CC tomo · tomo slice 27/52.0]
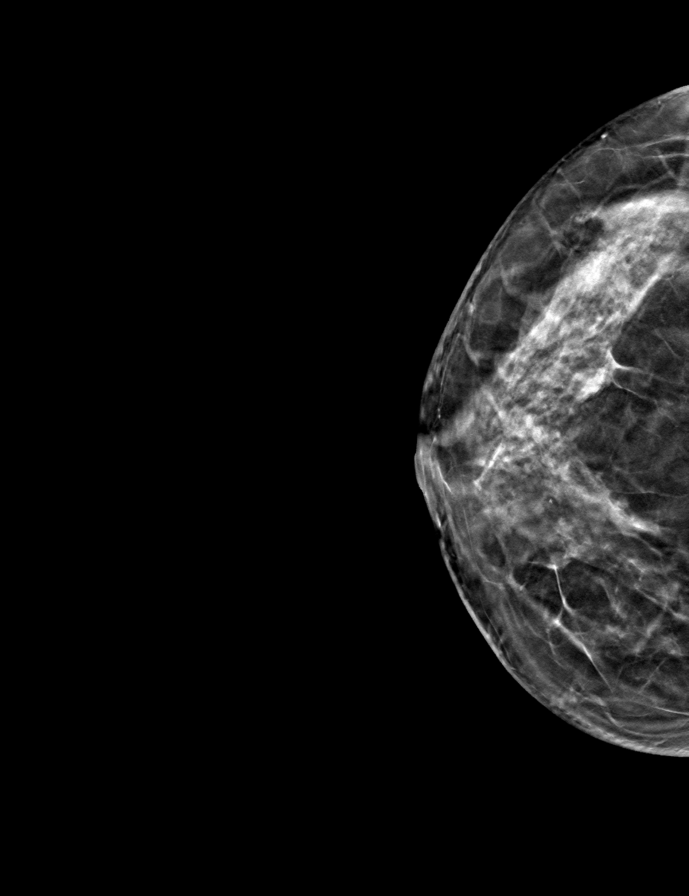

[L MLO tomo · tomo slice 29/56.0]
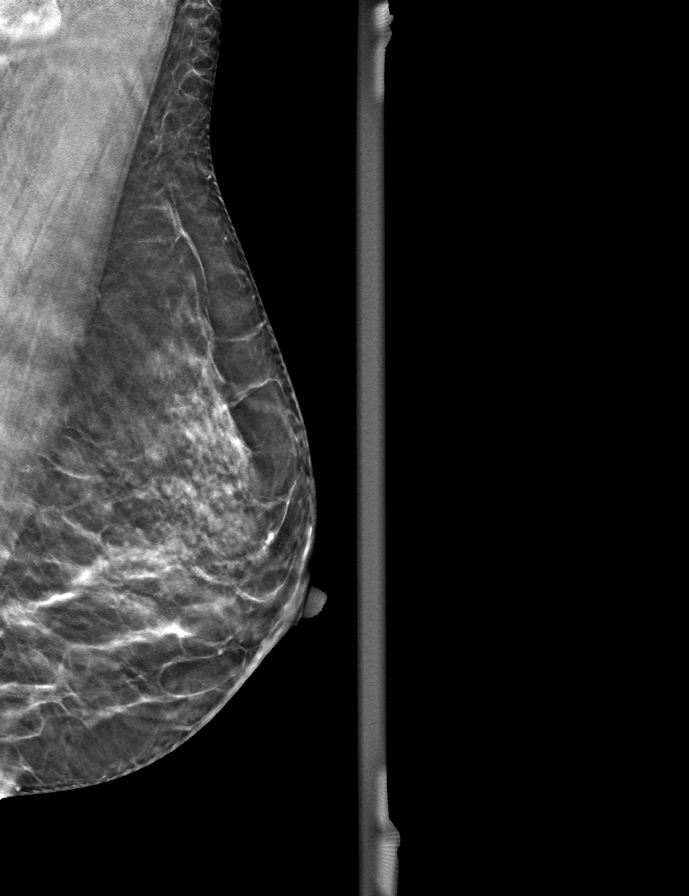

[R MLO tomo · tomo slice 27/53.0]
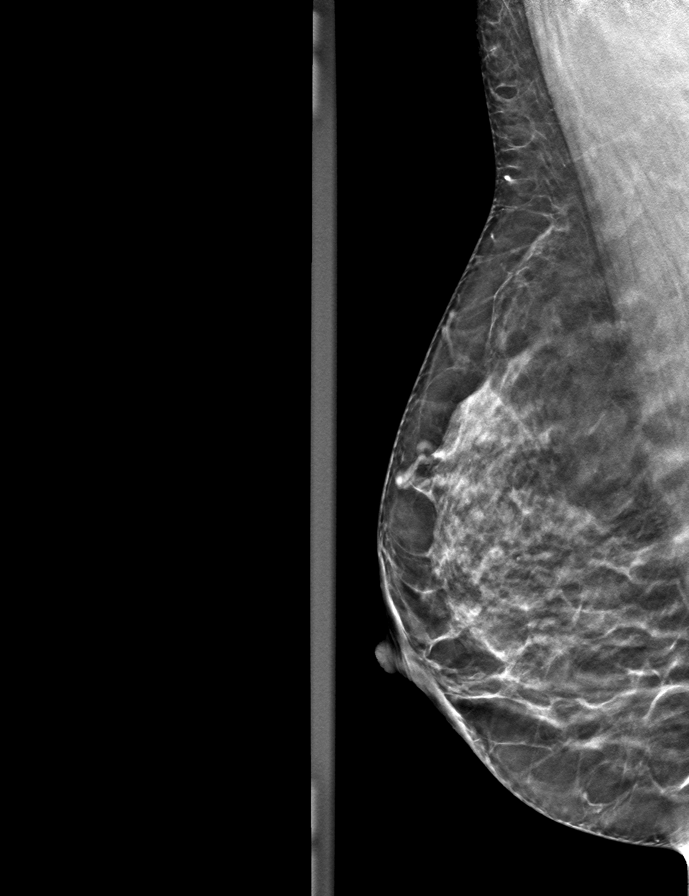

[9 of 24 positions shown; findings below may reference images not displayed]

ACR Breast Density Category c: The breast tissue is heterogeneously
dense, which may obscure small masses.
FINDINGS: In the left breast, a possible mass warrants further evaluation. In
the right breast, no findings suspicious for malignancy.
IMPRESSION: Further evaluation is suggested for a possible mass in the left
breast.

RECOMMENDATION:
Diagnostic mammogram and possibly ultrasound of the left breast.
(Code:O7-4-DDG)

The patient will be contacted regarding the findings, and additional
imaging will be scheduled.

BI-RADS CATEGORY  0: Incomplete. Need additional imaging evaluation
and/or prior mammograms for comparison.

## 2023-08-30 IMAGING — US US BREAST*L* LIMITED INC AXILLA
1 series · 5 of 5 positions shown · non-contrast
Comparison: Previous exam(s).

CLINICAL DATA: 40-year-old female recalled from baseline screening
mammogram dated 03/10/2021 for a possible left breast mass.

EXAM:
DIGITAL DIAGNOSTIC UNILATERAL LEFT MAMMOGRAM WITH TOMOSYNTHESIS AND
CAD; ULTRASOUND LEFT BREAST LIMITED
TECHNIQUE: Left digital diagnostic mammography and breast tomosynthesis was
performed. The images were evaluated with computer-aided detection.;
Targeted ultrasound examination of the left breast was performed.

[Series 1: us breast*left* limited inc axilla · 0.06mm/px · 5 of 5 slices shown]
[im 1/5]
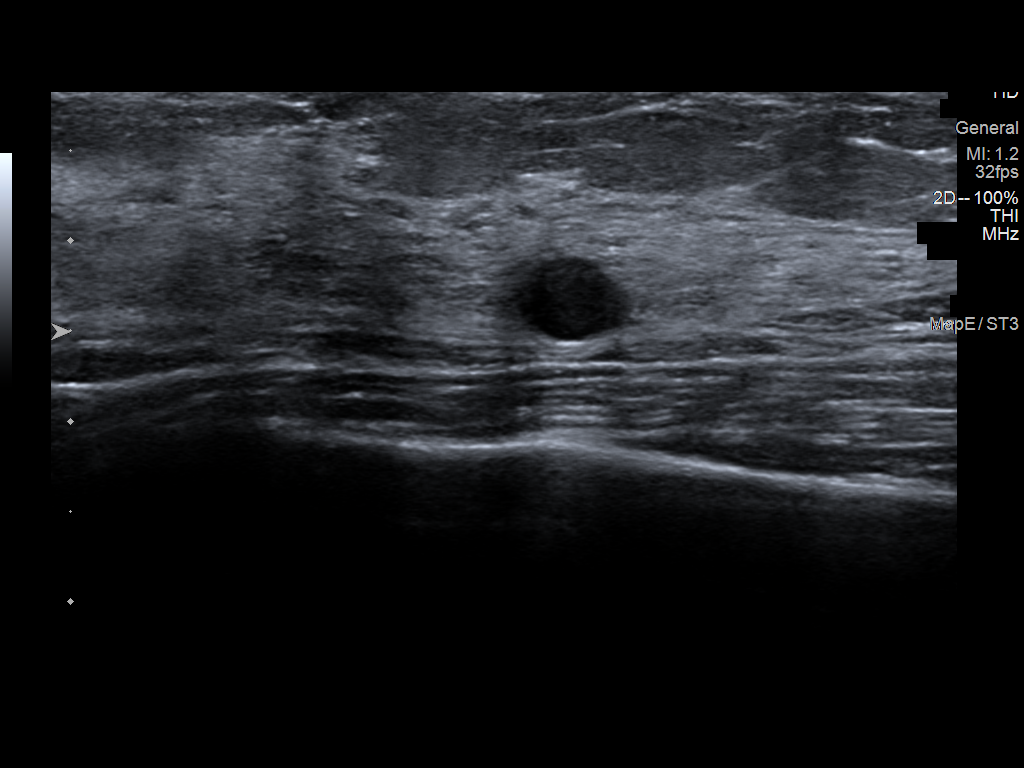
[im 2/5]
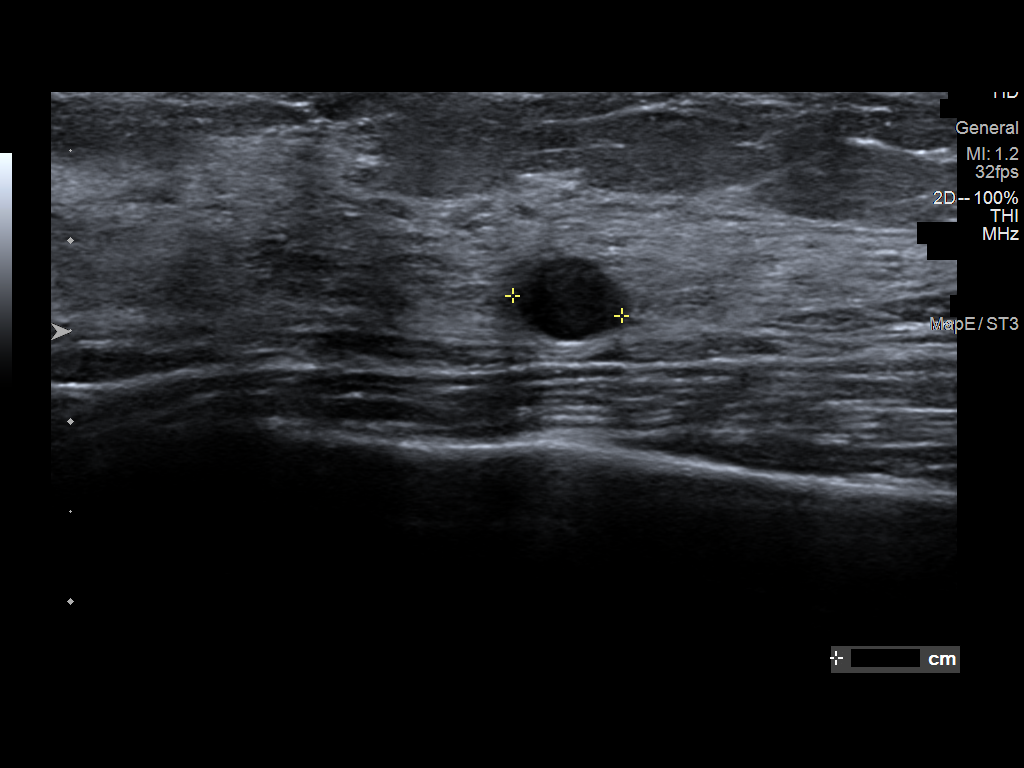
[im 3/5]
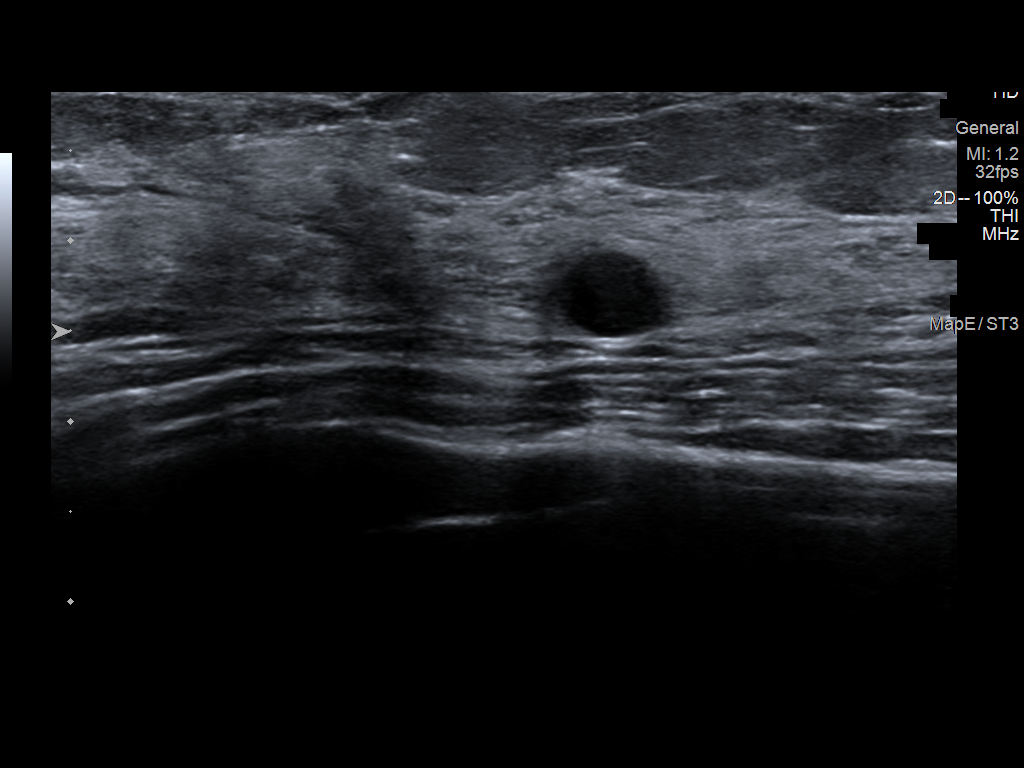
[im 4/5]
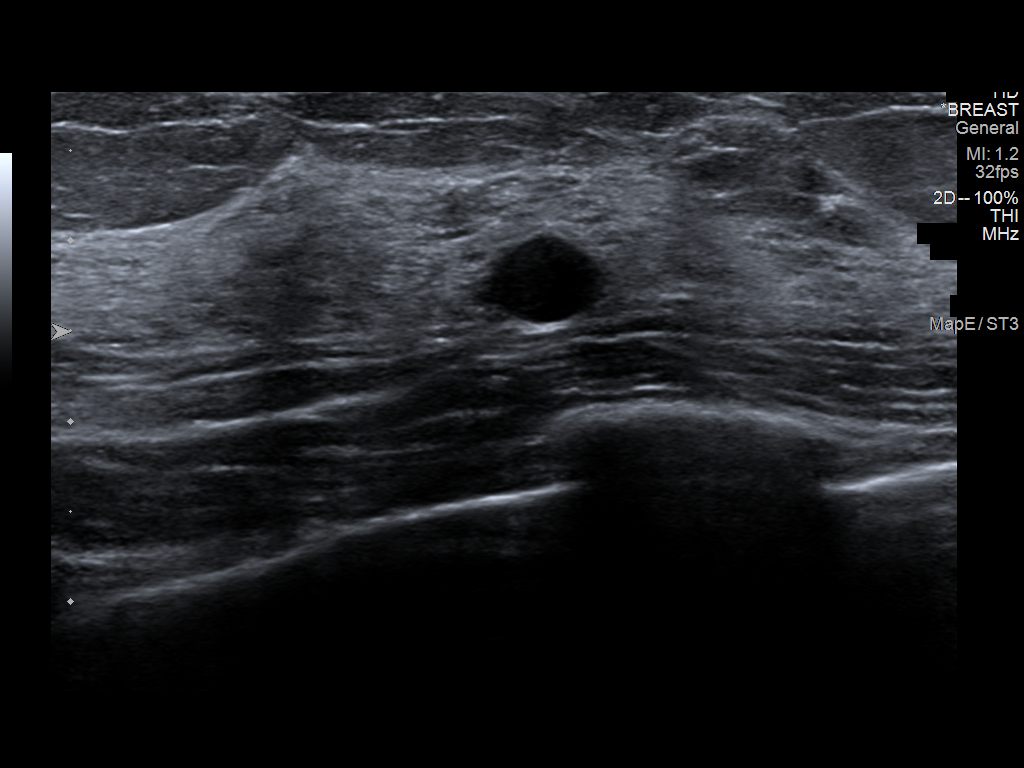
[im 5/5]
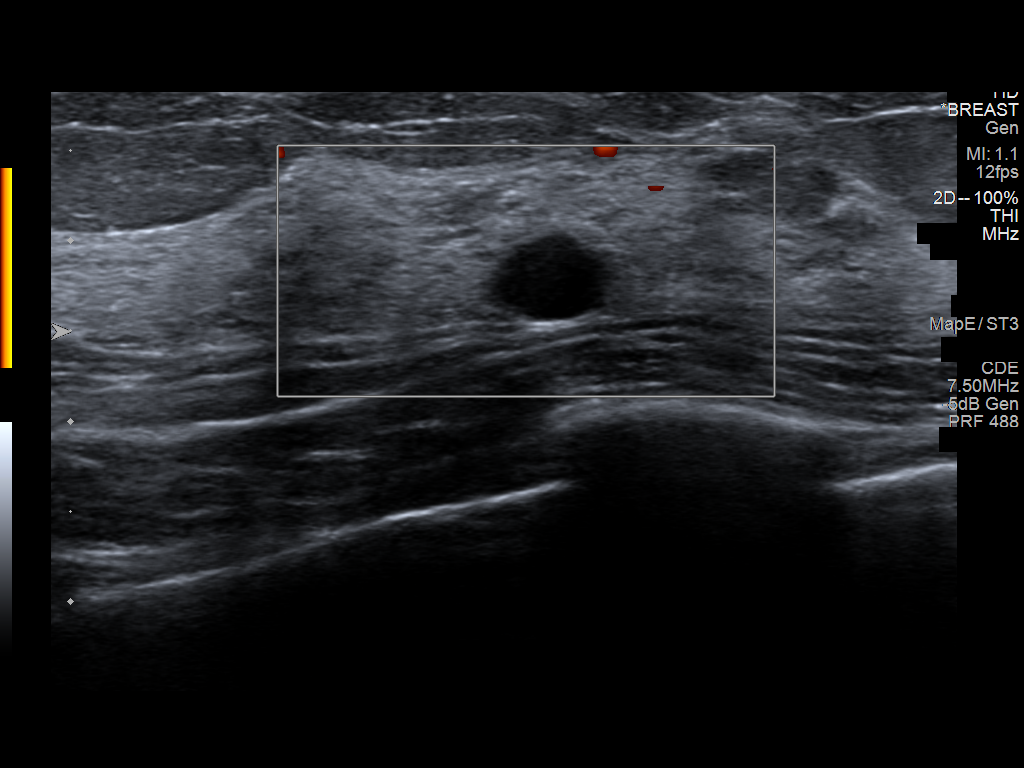

[5 of 5 positions shown; findings below may reference images not displayed]

ACR Breast Density Category c: The breast tissue is heterogeneously
dense, which may obscure small masses.
FINDINGS: There is a persistent round, circumscribed equal density mass in the
upper outer left breast at mid depth. Further evaluation with
ultrasound was performed.

Targeted ultrasound is performed, showing an oval, circumscribed
nearly anechoic mass at the 2 o'clock position 3 cm from the nipple.
There is posterior acoustic enhancement and no associated
vascularity. It measures 0.6 x 0.6 x 0.5 cm. This correlates well
with the mammographic finding.
IMPRESSION: Probably benign, minimally complicated cyst corresponding with the
left breast screening mammographic findings. Recommendation is for
short-term imaging follow-up.

RECOMMENDATION:
Left breast ultrasound in 6 months.

I have discussed the findings and recommendations with the patient.
If applicable, a reminder letter will be sent to the patient
regarding the next appointment.

BI-RADS CATEGORY  3: Probably benign.

## 2023-12-12 ENCOUNTER — Other Ambulatory Visit: Payer: Self-pay

## 2023-12-12 MED ORDER — LEVOTHYROXINE SODIUM 50 MCG PO TABS: 50.0000 ug | ORAL_TABLET | Freq: Every day | ORAL | 1 refills | Status: DC

## 2024-01-24 ENCOUNTER — Ambulatory Visit: Admitting: Internal Medicine

## 2024-01-24 ENCOUNTER — Encounter: Payer: Self-pay | Admitting: Internal Medicine

## 2024-01-24 VITALS — BP 102/63 | HR 60 | Temp 97.8°F | Ht 62.0 in | Wt 121.4 lb

## 2024-01-24 DIAGNOSIS — N939 Abnormal uterine and vaginal bleeding, unspecified: Secondary | ICD-10-CM | POA: Diagnosis not present

## 2024-01-24 DIAGNOSIS — E039 Hypothyroidism, unspecified: Secondary | ICD-10-CM

## 2024-01-24 DIAGNOSIS — N6320 Unspecified lump in the left breast, unspecified quadrant: Secondary | ICD-10-CM | POA: Diagnosis not present

## 2024-01-24 DIAGNOSIS — E038 Other specified hypothyroidism: Secondary | ICD-10-CM

## 2024-01-24 DIAGNOSIS — Z0000 Encounter for general adult medical examination without abnormal findings: Secondary | ICD-10-CM

## 2024-01-24 NOTE — Assessment & Plan Note (Signed)
 Oval mass of lateral left breast noted on mammogram 05/2022, likely benign. Repeat recommended in 1 year. We discussed this today and she is planning to have diagnostic mammogram and left breast US  completed at gynecology office in December of this year.

## 2024-01-24 NOTE — Assessment & Plan Note (Signed)
 Noting increased frequency (~20 days) with decreased flow of menstruation. Increased fatigue around the time of menstruation. We discussed checking CBC and iron studies today.

## 2024-01-24 NOTE — Assessment & Plan Note (Signed)
 Establishing with new gynecology office in December. Pap completed at a different gyn office 04/2023 NILM. Planning to have her f/u diagnostic mammogram and left breast US  completed at December visit.

## 2024-01-24 NOTE — Progress Notes (Signed)
 Established Patient Office Visit  Subjective   Patient ID: Diana Pierce, female    DOB: 10/03/1980  Age: 43 y.o. MRN: [id number]  Chief Complaint  Patient presents with   Medical Management of Chronic Issues    Check thyroid.    Ms. Diana Pierce returns to clinic for annual visit. Please see assessment/plan in problem-based charting for further details of today's visit.    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   Hypothyroidism 03/27/2021   Nephrolithiasis 03/27/2021   Healthcare maintenance 03/27/2021       Objective:     BP 102/63 (BP Location: Left Arm, Patient Position: Sitting, Cuff Size: Normal)   Pulse 60   Temp 97.8 F (36.6 C) (Oral)   Ht 5' 2 (1.575 m)   Wt 121 lb 6.4 oz (55.1 kg)   SpO2 99% Comment: RA  BMI 22.20 kg/m  BP Readings from Last 3 Encounters:  01/24/24 102/63  03/25/23 113/69  09/22/21 (!) 99/55   Wt Readings from Last 3 Encounters:  01/24/24 121 lb 6.4 oz (55.1 kg)  03/25/23 120 lb 6.4 oz (54.6 kg)  09/22/21 121 lb 9.6 oz (55.2 kg)      Physical Exam Constitutional:      General: She is not in acute distress.    Appearance: Normal appearance.  Neck:     Comments: No thyromegaly or nodularity noted Cardiovascular:     Rate and Rhythm: Normal rate and regular rhythm.     Heart sounds: Normal heart sounds.  Pulmonary:     Effort: Pulmonary effort is normal.     Breath sounds: Normal breath sounds.  Musculoskeletal:     Right lower leg: No edema.     Left lower leg: No edema.  Skin:    General: Skin is warm and dry.  Neurological:     General: No focal deficit present.     Mental Status: She is alert and oriented to person, place, and time.  Psychiatric:        Mood and Affect: Mood normal.        Behavior: Behavior normal.        Thought Content: Thought content normal.       Assessment  & Plan:   Problem List Items Addressed This Visit       Endocrine   Hypothyroidism - Primary   Continues Synthroid  daily for history of hypothyroidism. Thyroid exam wnl. Check annual TSH today.      Relevant Orders   TSH     Genitourinary   Abnormal uterine bleeding   Noting increased frequency (~20 days) with decreased flow of menstruation. Increased fatigue around the time of menstruation. We discussed checking CBC and iron studies today.      Relevant Orders   CBC no Diff   Ferritin   Iron and IBC ([id number])     Other   Healthcare maintenance   Establishing with new gynecology office in December. Pap completed at a different gyn office 04/2023 NILM. Planning to have her f/u diagnostic mammogram and left breast US  completed at December visit.      Mass of left breast   Oval mass of lateral left breast noted on mammogram 05/2022, likely benign. Repeat recommended in 1 year. We discussed this today and she is planning to have diagnostic mammogram and left breast US  completed at gynecology office  in December of this year.       Return in about 1 year (around 01/23/2025).    Ronnald Sergeant, MD

## 2024-01-24 NOTE — Assessment & Plan Note (Signed)
 Continues Synthroid  daily for history of hypothyroidism. Thyroid exam wnl. Check annual TSH today.

## 2024-01-25 ENCOUNTER — Ambulatory Visit: Payer: Self-pay | Admitting: Internal Medicine

## 2024-01-25 LAB — CBC
Hematocrit: 40.9 % (ref 34.0–46.6)
Hemoglobin: 13.1 g/dL (ref 11.1–15.9)
MCH: 30 pg (ref 26.6–33.0)
MCHC: 32 g/dL (ref 31.5–35.7)
MCV: 94 fL (ref 79–97)
Platelets: 237 x10E3/uL (ref 150–450)
RBC: 4.37 x10E6/uL (ref 3.77–5.28)
RDW: 12.4 % (ref 11.7–15.4)
WBC: 4.9 x10E3/uL (ref 3.4–10.8)

## 2024-01-25 LAB — TSH: TSH: 1.73 u[IU]/mL (ref 0.450–4.500)

## 2024-01-25 LAB — IRON AND TIBC
Iron Saturation: 37 % (ref 15–55)
Iron: 94 ug/dL (ref 27–159)
Total Iron Binding Capacity: 254 ug/dL (ref 250–450)
UIBC: 160 ug/dL (ref 131–425)

## 2024-01-25 LAB — FERRITIN: Ferritin: 220 ng/mL — ABNORMAL HIGH (ref 15–150)

## 2024-01-25 NOTE — Progress Notes (Signed)
 No anemia. Iron studies not consistent with iron deficiency.

## 2024-01-25 NOTE — Progress Notes (Signed)
 Normal TSH. Continue synthroid  at current dose.

## 2024-02-29 IMAGING — US US BREAST*L* LIMITED INC AXILLA
1 series · 5 of 5 positions shown · non-contrast
Comparison: 04/08/2021.

CLINICAL DATA: Short-term interval follow-up of a likely benign
mass involving the UPPER OUTER QUADRANT of the LEFT breast
identified on baseline screening mammography in March 2021.

EXAM:
ULTRASOUND OF THE LEFT BREAST

[Series 1: us breast*left* limited inc axilla · 0.06mm/px · 5 of 5 slices shown]
[im 1/5]
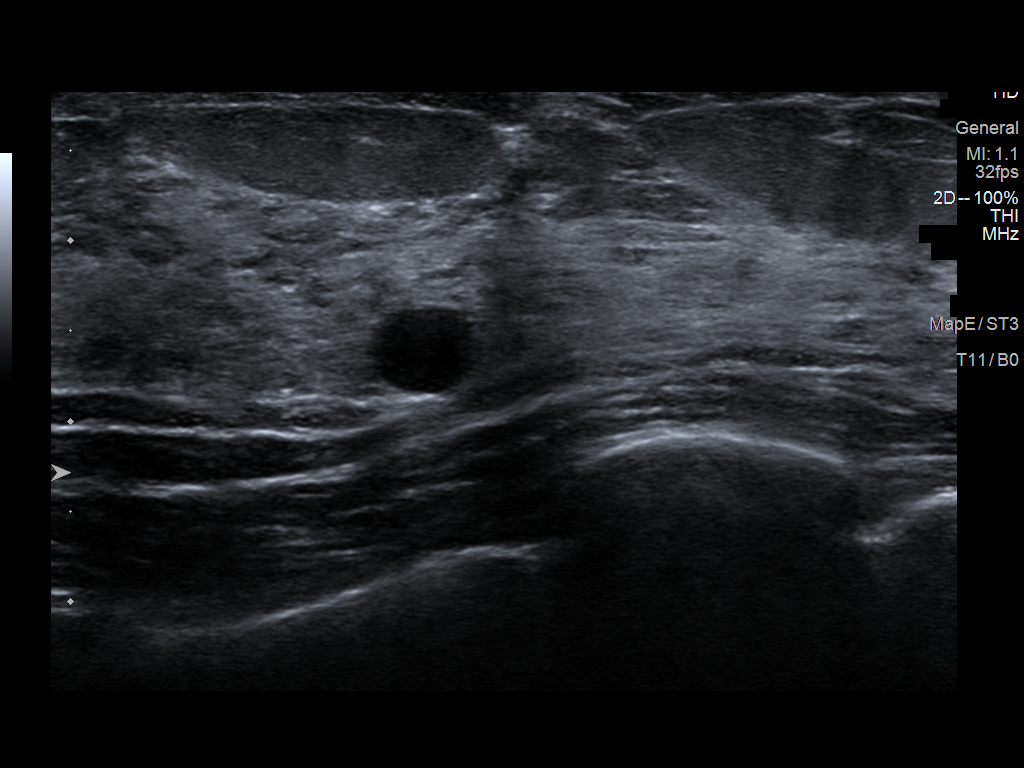
[im 2/5]
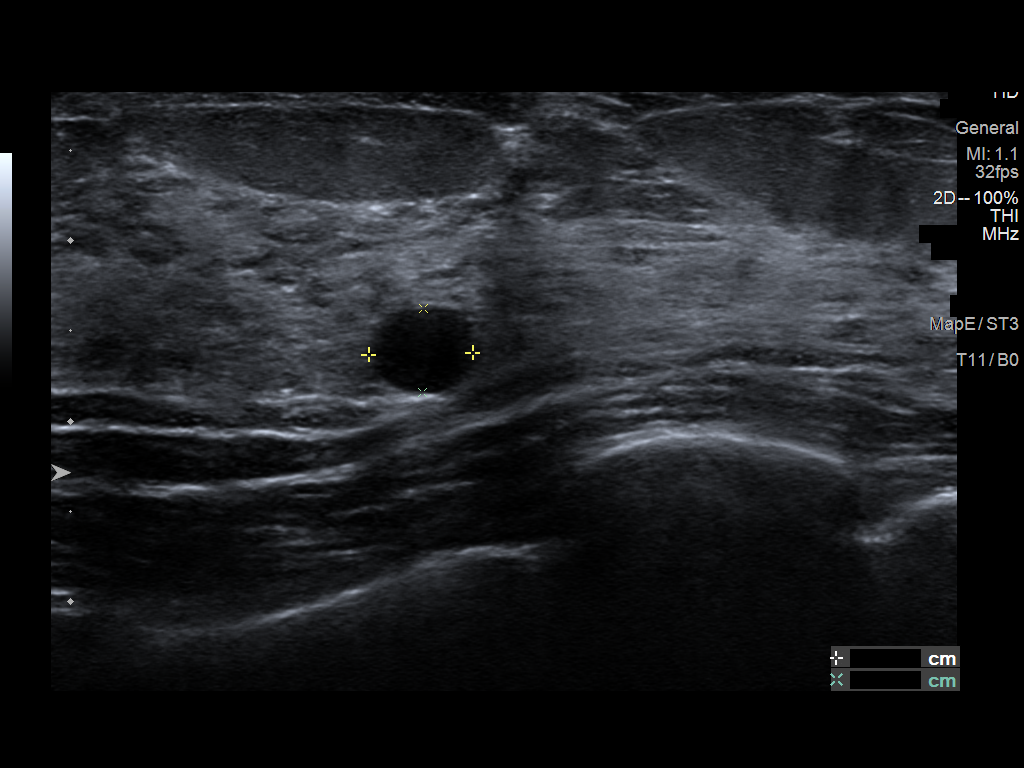
[im 3/5]
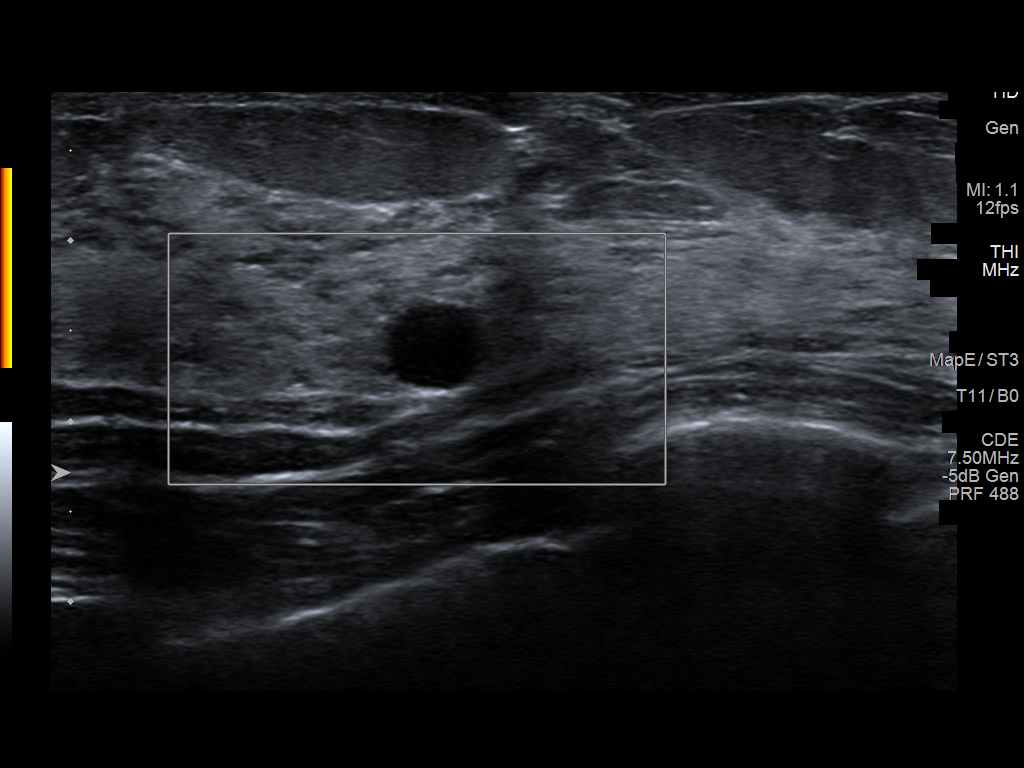
[im 4/5]
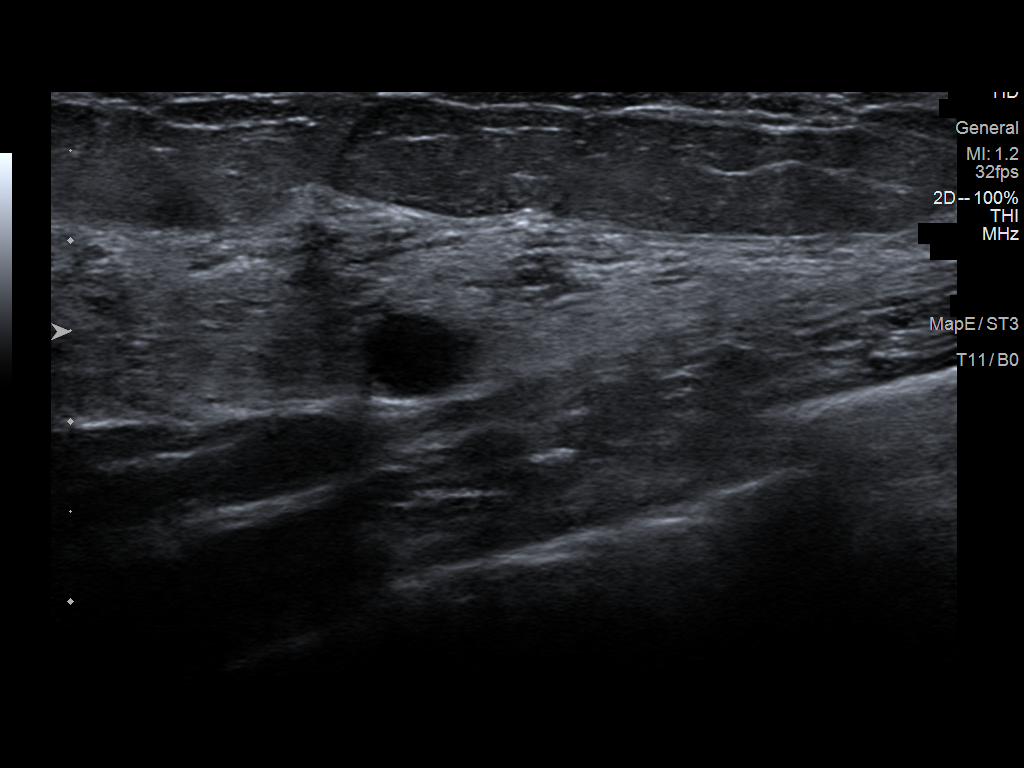
[im 5/5]
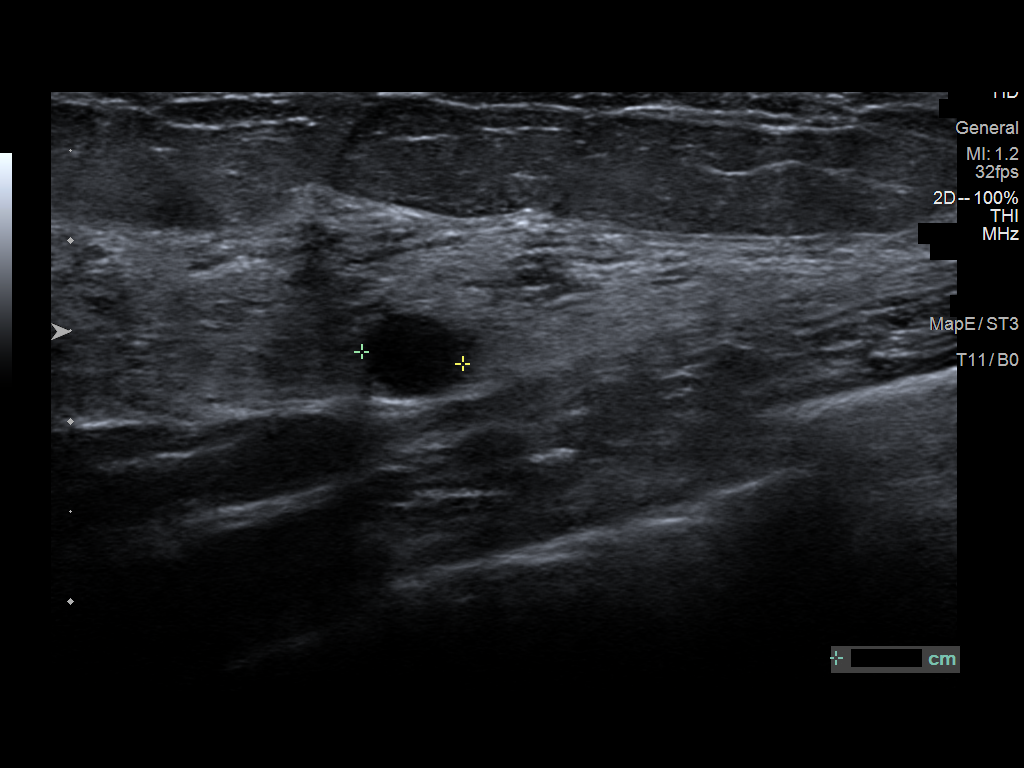

[5 of 5 positions shown; findings below may reference images not displayed]

FINDINGS: Targeted ultrasound is performed, showing the previously identified
nearly anechoic mass with scattered internal echoes at the 2 o'clock
position 3 cm from the nipple at posterior depth, measuring
approximately 0.6 x 0.5 x 0.6 cm (previously 0.7 x 0.5 x 0.6 cm),
demonstrating posterior acoustic enhancement and no internal power
Doppler flow.
IMPRESSION: Stable likely benign 0.6 cm complicated cyst involving the UPPER
OUTER QUADRANT of the LEFT breast.

RECOMMENDATION:
LEFT breast ultrasound at the time of annual BILATERAL diagnostic
mammography in 6 months.

I have discussed the findings and recommendations with the patient.
If applicable, a reminder letter will be sent to the patient
regarding the next appointment.

BI-RADS CATEGORY  3: Probably benign.

## 2024-04-16 ENCOUNTER — Telehealth: Admitting: Physician Assistant

## 2024-04-16 DIAGNOSIS — J019 Acute sinusitis, unspecified: Secondary | ICD-10-CM | POA: Diagnosis not present

## 2024-04-16 DIAGNOSIS — B9789 Other viral agents as the cause of diseases classified elsewhere: Secondary | ICD-10-CM | POA: Diagnosis not present

## 2024-04-16 MED ORDER — CETIRIZINE-PSEUDOEPHEDRINE ER 5-120 MG PO TB12: 1.0000 | ORAL_TABLET | Freq: Two times a day (BID) | ORAL | 0 refills | Status: AC

## 2024-04-16 MED ORDER — FLUTICASONE PROPIONATE 50 MCG/ACT NA SUSP
2.0000 | Freq: Every day | NASAL | 6 refills | Status: AC
Start: 2024-04-16 — End: ?

## 2024-04-16 NOTE — Patient Instructions (Signed)
  Lucienne Lambert, thank you for joining Teena Shuck, PA-C for today's virtual visit.  While this provider is not your primary care provider (PCP), if your PCP is located in our provider database this encounter information will be shared with them immediately following your visit.   A [facility] MyChart account gives you access to today's visit and all your visits, tests, and labs performed at [facility]  click here if you don't have a [facility] MyChart account or go to [email or web address]  Consent: (Patient) Lucienne Lambert provided verbal consent for this virtual visit at the beginning of the encounter.  Current Medications:  Current Outpatient Medications:    levothyroxine  (SYNTHROID ) 50 MCG tablet, Take 1 tablet (50 mcg total) by mouth daily before breakfast., Disp: 90 tablet, Rfl: 1   Medications ordered in this encounter:  No orders of the defined types were placed in this encounter.    *If you need refills on other medications prior to your next appointment, please contact your pharmacy*  Follow-Up: Call back or seek an in-person evaluation if the symptoms worsen or if the condition fails to improve as anticipated.  [facility] [phone]  Other Instructions Follow up with primary provider in 24-48 hours. Report to nearest ER with any worsening symptoms.    If you have been instructed to have an in-person evaluation today at a local Urgent Care facility, please use the link below. It will take you to a list of all of our available [facility], including address, phone number and hours of operation. Please do not delay care.  [facility]  If you or a family member do not have a primary care provider, use the link below to schedule a visit and establish care. When you choose a [facility] primary care physician or advanced practice provider, you gain a long-term partner in health. Find a Primary Care  Provider  Learn more about [facility]'s in-office and virtual care options: [facility] - Get Care Now

## 2024-04-16 NOTE — Progress Notes (Signed)
 Virtual Visit Consent   Diana Pierce, you are scheduled for a virtual visit with a [facility] provider today. Just as with appointments in the office, your consent must be obtained to participate. Your consent will be active for this visit and any virtual visit you may have with one of our providers in the next 365 days. If you have a MyChart account, a copy of this consent can be sent to you electronically.  As this is a virtual visit, video technology does not allow for your provider to perform a traditional examination. This may limit your provider's ability to fully assess your condition. If your provider identifies any concerns that need to be evaluated in person or the need to arrange testing (such as labs, EKG, etc.), we will make arrangements to do so. Although advances in technology are sophisticated, we cannot ensure that it will always work on either your end or our end. If the connection with a video visit is poor, the visit may have to be switched to a telephone visit. With either a video or telephone visit, we are not always able to ensure that we have a secure connection.  By engaging in this virtual visit, you consent to the provision of healthcare and authorize for your insurance to be billed (if applicable) for the services provided during this visit. Depending on your insurance coverage, you may receive a charge related to this service.  I need to obtain your verbal consent now. Are you willing to proceed with your visit today? Diana Pierce has provided verbal consent on 04/16/2024 for a virtual visit (video or telephone). Teena Shuck, [place]  Date: 04/16/2024 1:43 PM   Virtual Visit via Video Note   I, Catia Todorov Shuck, connected with  Diana Pierce  ([id number], [birth year]) on 04/16/24 at  1:45 PM EST by a video-enabled telemedicine application and verified that I am speaking with the correct person using two identifiers.  Location: Patient: Virtual Visit Location  Patient: Home Provider: Virtual Visit Location Provider: Home Office   I discussed the limitations of evaluation and management by telemedicine and the availability of in person appointments. The patient expressed understanding and agreed to proceed.    History of Present Illness: Diana Pierce is a 43 y.o. who identifies as a female who was assigned female at birth, and is being seen today for sinus pressure.  HPI: Sinusitis The current episode started in the past 7 days. The problem is unchanged. There has been no fever. Her pain is at a severity of 8/10. The pain is moderate. Associated symptoms include chills, congestion, headaches and sinus pressure. Pertinent negatives include no sore throat. Past treatments include acetaminophen. The treatment provided no relief.    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  Resting comfortably  at home.  Head is normocephalic, atraumatic.  No labored  breathing.  Speech is clear and coherent with logical content.  Patient is alert and oriented at baseline.    Assessment and Plan: 1. Acute viral sinusitis (Primary)  Presentation consistent with sinusitis.  No evidence of other bacterial infections including pneumonia, meningitis, pharyngitis, otitis media.  Discussed  that this fits picture of viral sinusitis and that due to type and duration of symptoms and exam findings, we will treat as such. Zyrtec D and flonase prescribed. Advised to continue ibuprofen and Tylenol at home. Patient is to follow up with primary physician if having continued symptoms.   Advised patient on supportive therapies, including using a cool-mist vaporizer/humidifier/steam from hot showers, OTC throat lozenges, advancement of fluids as tolerated, nasal saline sprays, rest, OTC acetaminophen or ibuprofen for pain control, frequent handwashing.  Follow Up Instructions: I discussed the assessment and treatment plan with the patient. The patient was provided an opportunity to ask questions and all were answered. The patient agreed with the plan and demonstrated an understanding of the instructions.  A copy of instructions were sent to the patient via MyChart unless otherwise noted below.    The patient was advised to call back or seek an in-person evaluation if the symptoms worsen or if the condition fails to improve as anticipated.    Teena Shuck, PA-C

## 2024-04-17 ENCOUNTER — Telehealth: Admitting: Family Medicine

## 2024-04-17 DIAGNOSIS — J0100 Acute maxillary sinusitis, unspecified: Secondary | ICD-10-CM

## 2024-04-17 MED ORDER — AZELASTINE HCL 0.1 % NA SOLN: 2.0000 | Freq: Two times a day (BID) | NASAL | 0 refills | Status: AC

## 2024-04-17 MED ORDER — AZITHROMYCIN 250 MG PO TABS: ORAL_TABLET | ORAL | 0 refills | Status: AC

## 2024-04-17 NOTE — Progress Notes (Signed)
 Virtual Visit Consent   Diana Pierce, you are scheduled for a virtual visit with a [facility] provider today. Just as with appointments in the office, your consent must be obtained to participate. Your consent will be active for this visit and any virtual visit you may have with one of our providers in the next 365 days. If you have a MyChart account, a copy of this consent can be sent to you electronically.  As this is a virtual visit, video technology does not allow for your provider to perform a traditional examination. This may limit your provider's ability to fully assess your condition. If your provider identifies any concerns that need to be evaluated in person or the need to arrange testing (such as labs, EKG, etc.), we will make arrangements to do so. Although advances in technology are sophisticated, we cannot ensure that it will always work on either your end or our end. If the connection with a video visit is poor, the visit may have to be switched to a telephone visit. With either a video or telephone visit, we are not always able to ensure that we have a secure connection.  By engaging in this virtual visit, you consent to the provision of healthcare and authorize for your insurance to be billed (if applicable) for the services provided during this visit. Depending on your insurance coverage, you may receive a charge related to this service.  I need to obtain your verbal consent now. Are you willing to proceed with your visit today? Diana Pierce has provided verbal consent on 04/17/2024 for a virtual visit (video or telephone). Chiquita CHRISTELLA Barefoot, NP  Date: 04/17/2024 9:15 AM   Virtual Visit via Video Note   I, Chiquita CHRISTELLA Barefoot, connected with  Diana Pierce  ([id number], 02/12/1981) on 04/17/24 at  9:15 AM EST by a video-enabled telemedicine application and verified that I am speaking with the correct person using two identifiers.  Location: Patient: Virtual Visit Location  Patient: Home Provider: Virtual Visit Location Provider: Home Office   I discussed the limitations of evaluation and management by telemedicine and the availability of in person appointments. The patient expressed understanding and agreed to proceed.    History of Present Illness: Diana Pierce is a 43 y.o. who identifies as a female who was assigned female at birth, and is being seen today for sinus congestion  Onset was in last 7 days- with sinus pressure between eye brows and cheeks. Feels like head is going to explode Associated symptoms are vertigo sensation, ears feel muffled- some mild cracking/ popping.  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   Hypothyroidism 03/27/2021   Nephrolithiasis 03/27/2021   Healthcare maintenance 03/27/2021    Allergies:  Allergies  Allergen Reactions   Iodinated Contrast Media Shortness Of Breath   Other Shortness Of Breath and Swelling    Per patient, she exposed to tree nut    Medications:  Current Outpatient Medications:    cetirizine-pseudoephedrine (ZYRTEC-D) 5-120 MG tablet, Take 1 tablet by mouth 2 (two) times daily for 5 days., Disp: 10 tablet, Rfl: 0   fluticasone (FLONASE) 50 MCG/ACT nasal spray, Place 2 sprays into  both nostrils daily., Disp: 16 g, Rfl: 6   levothyroxine  (SYNTHROID ) 50 MCG tablet, Take 1 tablet (50 mcg total) by mouth daily before breakfast., Disp: 90 tablet, Rfl: 1  Observations/Objective: Patient is well-developed, well-nourished in no acute distress.  Resting comfortably  at home.  Head is normocephalic, atraumatic.  No labored breathing.  Speech is clear and  coherent with logical content.  Patient is alert and oriented at baseline.    Assessment and Plan:  1. Acute non-recurrent maxillary sinusitis (Primary)  - azithromycin (ZITHROMAX) 250 MG tablet; Take 2 tablets on day 1, then 1 tablet daily on days 2 through 5  Dispense: 6 tablet; Refill: 0 - azelastine (ASTELIN) 0.1 % nasal spray; Place 2 sprays into both nostrils 2 (two) times daily. Use in each nostril as directed  Dispense: 30 mL; Refill: 0   -Take meds as prescribed- Reviewed Flonase use in detail -Rest -Use a cool mist humidifier especially during the winter months when heat dries out the air. - Use saline nose sprays frequently to help soothe nasal passages and promote drainage. -Saline irrigations of the nose can be very helpful if done frequently.             * 4X daily for 1 week*             * Use of a nettie pot can be helpful with this.  *Follow directions with this* *Boiled or distilled water only -stay hydrated by drinking plenty of fluids - Keep thermostat turn down low to prevent drying out sinuses - For fever or aches or pains- take tylenol or ibuprofen as directed on bottle             * for fevers greater than 101 orally you may alternate ibuprofen and tylenol every 3 hours.  If you do not improve you will need a follow up visit in person.                Reviewed side effects, risks and benefits of medication.    Patient acknowledged agreement and understanding of the plan.   Past Medical, Surgical, Social History, Allergies, and Medications have been Reviewed.    Follow Up Instructions: I discussed the assessment and treatment plan with the patient. The patient was provided an opportunity to ask questions and all were answered. The patient agreed with the plan and demonstrated an understanding of the instructions.  A copy of instructions were sent to the patient via MyChart unless otherwise noted below.    The patient was advised to call back or seek an  in-person evaluation if the symptoms worsen or if the condition fails to improve as anticipated.    Chiquita CHRISTELLA Barefoot, NP

## 2024-04-17 NOTE — Patient Instructions (Addendum)
 Diana Pierce, thank you for joining Chiquita CHRISTELLA Barefoot, NP for today's virtual visit.  While this provider is not your primary care provider (PCP), if your PCP is located in our provider database this encounter information will be shared with them immediately following your visit.   A [facility] MyChart account gives you access to today's visit and all your visits, tests, and labs performed at [facility]  click here if you don't have a [facility] MyChart account or go to [email or web address]  Consent: (Patient) Diana Pierce provided verbal consent for this virtual visit at the beginning of the encounter.  Current Medications:  Current Outpatient Medications:    azelastine (ASTELIN) 0.1 % nasal spray, Place 2 sprays into both nostrils 2 (two) times daily. Use in each nostril as directed, Disp: 30 mL, Rfl: 0   azithromycin (ZITHROMAX) 250 MG tablet, Take 2 tablets on day 1, then 1 tablet daily on days 2 through 5, Disp: 6 tablet, Rfl: 0   cetirizine-pseudoephedrine (ZYRTEC-D) 5-120 MG tablet, Take 1 tablet by mouth 2 (two) times daily for 5 days., Disp: 10 tablet, Rfl: 0   fluticasone (FLONASE) 50 MCG/ACT nasal spray, Place 2 sprays into both nostrils daily., Disp: 16 g, Rfl: 6   levothyroxine  (SYNTHROID ) 50 MCG tablet, Take 1 tablet (50 mcg total) by mouth daily before breakfast., Disp: 90 tablet, Rfl: 1   Medications ordered in this encounter:  Meds ordered this encounter  Medications   azithromycin (ZITHROMAX) 250 MG tablet    Sig: Take 2 tablets on day 1, then 1 tablet daily on days 2 through 5    Dispense:  6 tablet    Refill:  0    Supervising Provider:   LAMPTEY, PHILIP O [[id number]]   azelastine (ASTELIN) 0.1 % nasal spray    Sig: Place 2 sprays into both nostrils 2 (two) times daily. Use in each nostril as directed    Dispense:  30 mL    Refill:  0    Supervising Provider:   BLAISE ALEENE KIDD [[id number]]     *If you need refills on other medications  prior to your next appointment, please contact your pharmacy*  Follow-Up: Call back or seek an in-person evaluation if the symptoms worsen or if the condition fails to improve as anticipated.  [facility] [phone]  Other Instructions   -Take meds as prescribed- Reviewed Flonase use in detail -Rest -Use a cool mist humidifier especially during the winter months when heat dries out the air. - Use saline nose sprays frequently to help soothe nasal passages and promote drainage. -Saline irrigations of the nose can be very helpful if done frequently.             * 4X daily for 1 week*             * Use of a nettie pot can be helpful with this.  *Follow directions with this* *Boiled or distilled water only -stay hydrated by drinking plenty of fluids - Keep thermostat turn down low to prevent drying out sinuses - For fever or aches or pains- take tylenol or ibuprofen as directed on bottle             * for fevers greater than 101 orally you may alternate ibuprofen and tylenol every 3 hours.  If you do not improve you will need a follow up visit in person.  If you have been instructed to have an in-person evaluation today at a local Urgent Care facility, please use the link below. It will take you to a list of all of our available [facility], including address, phone number and hours of operation. Please do not delay care.  [facility]  If you or a family member do not have a primary care provider, use the link below to schedule a visit and establish care. When you choose a [facility] primary care physician or advanced practice provider, you gain a long-term partner in health. Find a Primary Care Provider  Learn more about [facility]'s in-office and virtual care options: [facility] - Get Care Now

## 2024-06-05 ENCOUNTER — Telehealth: Payer: Self-pay | Admitting: Internal Medicine

## 2024-06-05 MED ORDER — LEVOTHYROXINE SODIUM 50 MCG PO TABS: 50.0000 ug | ORAL_TABLET | Freq: Every day | ORAL | 1 refills | Status: AC

## 2024-06-05 NOTE — Telephone Encounter (Signed)
 Prescription Request  06/05/2024  LOV: 01/24/2024  What is the name of the medication or equipment?  levothyroxine  (SYNTHROID ) 50 MCG tablet  Have you contacted your pharmacy to request a refill? Yes   Which pharmacy would you like this sent to?  [id number] - [place], [place] - [address] AT [address] OF [facility] [address] [place]  [address] Phone: [phone] Fax: [phone]    Patient notified that their request is being sent to the clinical staff for review and that they should receive a response within 2 business days.   Please advise at [facility] [phone]
